# Patient Record
Sex: Male | Born: 1990 | Race: Black or African American | Hispanic: No | Marital: Single | Smoking: Current every day smoker
Health system: Southern US, Community
[De-identification: ages and names within clinical notes are randomized; demographics above are authoritative.]

## PROBLEM LIST (undated history)

## (undated) ENCOUNTER — Emergency Department (HOSPITAL_COMMUNITY): Discharge status: Absent without leave | Payer: Self-pay

## (undated) ENCOUNTER — Emergency Department (HOSPITAL_COMMUNITY): Admission: EM | Payer: Self-pay | Source: Home / Self Care

## (undated) DIAGNOSIS — A599 Trichomoniasis, unspecified: Secondary | ICD-10-CM

---
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
## 2013-01-15 ENCOUNTER — Ambulatory Visit: Payer: Self-pay

## 2013-01-16 ENCOUNTER — Ambulatory Visit (INDEPENDENT_AMBULATORY_CARE_PROVIDER_SITE_OTHER): Payer: BC Managed Care – PPO | Admitting: Family Medicine

## 2013-01-16 VITALS — BP 124/74 | HR 86 | Temp 98.0°F | Resp 16 | Ht 68.5 in | Wt 157.0 lb

## 2013-01-16 DIAGNOSIS — R61 Generalized hyperhidrosis: Secondary | ICD-10-CM

## 2013-01-16 DIAGNOSIS — Z113 Encounter for screening for infections with a predominantly sexual mode of transmission: Secondary | ICD-10-CM

## 2013-01-16 DIAGNOSIS — N50819 Testicular pain, unspecified: Secondary | ICD-10-CM

## 2013-01-16 DIAGNOSIS — N509 Disorder of male genital organs, unspecified: Secondary | ICD-10-CM

## 2013-01-16 LAB — COMPREHENSIVE METABOLIC PANEL
AST: 12 U/L (ref 0–37)
Alkaline Phosphatase: 57 U/L (ref 39–117)
BUN: 11 mg/dL (ref 6–23)
Creat: 0.77 mg/dL (ref 0.50–1.35)
Glucose, Bld: 86 mg/dL (ref 70–99)
Potassium: 4.6 mEq/L (ref 3.5–5.3)
Total Bilirubin: 0.6 mg/dL (ref 0.3–1.2)

## 2013-01-16 LAB — POCT CBC
Granulocyte percent: 71.8 %G (ref 37–80)
HCT, POC: 48.8 % (ref 43.5–53.7)
Hemoglobin: 15.6 g/dL (ref 14.1–18.1)
Lymph, poc: 1.3 (ref 0.6–3.4)
MCH, POC: 30.6 pg (ref 27–31.2)
MCHC: 32 g/dL (ref 31.8–35.4)
MCV: 95.9 fL (ref 80–97)
MID (cbc): 0.3 (ref 0–0.9)
MPV: 9.6 fL (ref 0–99.8)
POC Granulocyte: 4.1 (ref 2–6.9)
POC LYMPH PERCENT: 23 %L (ref 10–50)
POC MID %: 5.2 % (ref 0–12)
Platelet Count, POC: 295 10*3/uL (ref 142–424)
RBC: 5.09 M/uL (ref 4.69–6.13)
RDW, POC: 12.5 %
WBC: 5.7 10*3/uL (ref 4.6–10.2)

## 2013-01-16 LAB — HEPATITIS B SURFACE ANTIGEN: Hepatitis B Surface Ag: NEGATIVE

## 2013-01-16 LAB — COMPREHENSIVE METABOLIC PANEL WITH GFR
ALT: 10 U/L (ref 0–53)
Albumin: 4.7 g/dL (ref 3.5–5.2)
CO2: 27 meq/L (ref 19–32)
Calcium: 9.5 mg/dL (ref 8.4–10.5)
Chloride: 101 meq/L (ref 96–112)
Sodium: 138 meq/L (ref 135–145)
Total Protein: 6.8 g/dL (ref 6.0–8.3)

## 2013-01-16 LAB — HEPATITIS C ANTIBODY: HCV Ab: NEGATIVE

## 2013-01-16 LAB — HIV ANTIBODY (ROUTINE TESTING W REFLEX): HIV: NONREACTIVE

## 2013-01-16 LAB — RPR

## 2013-01-16 NOTE — Progress Notes (Signed)
[facility]:  Office Visit  Chief Complaint:  Chief Complaint  Patient presents with  . Annual Exam    HPI: Manuel Kim is a 22 y.o. male who complains of:  1. Has a lump on left testicle, intermittent pain, night sweats a lot, feels tired, the lump on his testicle has been getting bigger. Has not gotten more painful just larger. No LAD. No weightloss. Paternal grandfather with prostate cancer. He states it gets worse when he is having sex. Denies abd pain, peliv pain, groin pain.   2. History of chlamydia. No other STDs.   History reviewed. No pertinent past medical history. History reviewed. No pertinent past surgical history. History   Social History  . Marital Status: Single    Spouse Name: N/A    Number of Children: N/A  . Years of Education: N/A   Social History Main Topics  . Smoking status: Current Every Day Smoker -- 0.50 packs/day    Types: Cigarettes  . Smokeless tobacco: None  . Alcohol Use: Yes  . Drug Use: Yes  . Sexually Active: Yes   Other Topics Concern  . None   Social History Narrative  . None   Family History  Problem Relation Age of Onset  . Cancer Maternal Grandmother   . Cancer Paternal Grandmother   . Cancer Paternal Grandfather    No Known Allergies Prior to Admission medications   Not on File     ROS: The patient denies fevers, chills, night sweats, unintentional weight loss, chest pain, palpitations, wheezing, dyspnea on exertion, nausea, vomiting, abdominal pain, dysuria, hematuria, melena, numbness, weakness, or tingling.   All other systems have been reviewed and were otherwise negative with the exception of those mentioned in the HPI and as above.    PHYSICAL EXAM: Filed Vitals:   01/16/13 1110  BP: 124/74  Pulse: 86  Temp: 98 F (36.7 C)  Resp: 16   Filed Vitals:   01/16/13 1110  Height: 5' 8.5" (1.74 m)  Weight: 157 lb (71.215 kg)   Body mass index is 23.52 kg/(m^2).  General: Alert, no acute  distress HEENT:  Normocephalic, atraumatic, oropharynx patent.  Cardiovascular:  Regular rate and rhythm, no rubs murmurs or gallops.  No Carotid bruits, radial pulse intact. No pedal edema.  Respiratory: Clear to auscultation bilaterally.  No wheezes, rales, or rhonchi.  No cyanosis, no use of accessory musculature GI: No organomegaly, abdomen is soft and non-tender, positive bowel sounds.  No masses. Skin: No rashes. Neurologic: Facial musculature symmetric. Psychiatric: Patient is appropriate throughout our interaction. Lymphatic: No cervical lymphadenopathy Musculoskeletal: Gait intact. GU-testicles normal, no masses, lesions, dc. Scrotum is normal as well, no obvious hydrocele. No inguinal hernias, no LAD.    LABS: Results for orders placed in visit on 01/16/13  COMPREHENSIVE METABOLIC PANEL      Result Value Range   Sodium 138  135 - 145 mEq/L   Potassium 4.6  3.5 - 5.3 mEq/L   Chloride 101  96 - 112 mEq/L   CO2 27  19 - 32 mEq/L   Glucose, Bld 86  70 - 99 mg/dL   BUN 11  6 - 23 mg/dL   Creat 1.61  0.96 - 0.45 mg/dL   Total Bilirubin 0.6  0.3 - 1.2 mg/dL   Alkaline Phosphatase 57  39 - 117 U/L   AST 12  0 - 37 U/L   ALT 10  0 - 53 U/L   Total Protein 6.8  6.0 - 8.3 g/dL   Albumin 4.7  3.5 - 5.2 g/dL   Calcium 9.5  8.4 - 96.0 mg/dL  HEPATITIS B SURFACE ANTIBODY, QUANTITATIVE      Result Value Range   Hepatitis B-Post      HEPATITIS B SURFACE ANTIGEN      Result Value Range   Hepatitis B Surface Ag NEGATIVE  NEGATIVE  HEPATITIS C ANTIBODY      Result Value Range   HCV Ab NEGATIVE  NEGATIVE  RPR      Result Value Range   RPR NON REAC  NON REAC  HIV ANTIBODY (ROUTINE TESTING)      Result Value Range   HIV NON REACTIVE  NON REACTIVE  POCT CBC      Result Value Range   WBC 5.7  4.6 - 10.2 K/uL   Lymph, poc 1.3  0.6 - 3.4   POC LYMPH PERCENT 23.0  10 - 50 %L   MID (cbc) 0.3  0 - 0.9   POC MID % 5.2  0 - 12 %M   POC Granulocyte 4.1  2 - 6.9   Granulocyte percent  71.8  37 - 80 %G   RBC 5.09  4.69 - 6.13 M/uL   Hemoglobin 15.6  14.1 - 18.1 g/dL   HCT, POC 45.4  09.8 - 53.7 %   MCV 95.9  80 - 97 fL   MCH, POC 30.6  27 - 31.2 pg   MCHC 32.0  31.8 - 35.4 g/dL   RDW, POC 11.9     Platelet Count, POC 295  142 - 424 K/uL   MPV 9.6  0 - 99.8 fL     EKG/XRAY:   Primary read interpreted by Dr. Conley Rolls at [facility].   ASSESSMENT/PLAN: Encounter Diagnoses  Name Primary?  . Persistent testicular pain   . Night sweats Yes  . Screening for STD (sexually transmitted disease)    STD labs pending Patient would like testicular US F/u prn    LE, THAO PHUONG, DO 01/17/2013 8:30 AM

## 2013-01-17 LAB — GC/CHLAMYDIA PROBE AMP, URINE
Chlamydia, Swab/Urine, PCR: NEGATIVE
GC Probe Amp, Urine: NEGATIVE

## 2013-01-17 LAB — HEPATITIS B SURFACE ANTIBODY, QUANTITATIVE: Hep B S AB Quant (Post): 11.1 m[IU]/mL

## 2013-01-18 ENCOUNTER — Other Ambulatory Visit: Payer: Self-pay | Admitting: Radiology

## 2013-01-18 DIAGNOSIS — N5082 Scrotal pain: Secondary | ICD-10-CM

## 2013-01-19 ENCOUNTER — Encounter: Payer: Self-pay | Admitting: *Deleted

## 2013-01-23 ENCOUNTER — Telehealth: Payer: Self-pay

## 2013-01-23 NOTE — Telephone Encounter (Signed)
Pt called regarding labs. Notified and number updated. Also was inquiring about referral. Referrals is going to refax pt's info to [facility] because for some reason the request was denied.

## 2013-01-31 ENCOUNTER — Inpatient Hospital Stay: Admission: RE | Admit: 2013-01-31 | Payer: Self-pay | Source: Ambulatory Visit

## 2013-01-31 ENCOUNTER — Other Ambulatory Visit: Payer: Self-pay

## 2013-02-01 ENCOUNTER — Other Ambulatory Visit: Payer: Self-pay

## 2013-02-26 ENCOUNTER — Emergency Department (INDEPENDENT_AMBULATORY_CARE_PROVIDER_SITE_OTHER)
Admission: EM | Admit: 2013-02-26 | Discharge: 2013-02-26 | Disposition: A | Discharge status: Home / Self Care | Payer: BC Managed Care – PPO | Source: Home / Self Care | Attending: Family Medicine | Admitting: Family Medicine

## 2013-02-26 ENCOUNTER — Encounter (HOSPITAL_COMMUNITY): Payer: Self-pay

## 2013-02-26 DIAGNOSIS — M25519 Pain in unspecified shoulder: Secondary | ICD-10-CM

## 2013-02-26 DIAGNOSIS — M79609 Pain in unspecified limb: Secondary | ICD-10-CM

## 2013-02-26 MED ORDER — CYCLOBENZAPRINE HCL 5 MG PO TABS: 5.0000 mg | ORAL_TABLET | Freq: Three times a day (TID) | ORAL | Status: DC | PRN

## 2013-02-26 MED ORDER — IBUPROFEN 800 MG PO TABS: 800.0000 mg | ORAL_TABLET | Freq: Three times a day (TID) | ORAL | Status: DC
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
## 2013-10-27 ENCOUNTER — Emergency Department (INDEPENDENT_AMBULATORY_CARE_PROVIDER_SITE_OTHER)
Admission: EM | Admit: 2013-10-27 | Discharge: 2013-10-27 | Disposition: A | Discharge status: Home / Self Care | Payer: BC Managed Care – PPO | Source: Home / Self Care | Attending: Emergency Medicine | Admitting: Emergency Medicine

## 2013-10-27 ENCOUNTER — Encounter (HOSPITAL_COMMUNITY): Payer: Self-pay | Admitting: Emergency Medicine

## 2013-10-27 DIAGNOSIS — R69 Illness, unspecified: Principal | ICD-10-CM

## 2013-10-27 DIAGNOSIS — J111 Influenza due to unidentified influenza virus with other respiratory manifestations: Secondary | ICD-10-CM

## 2013-10-27 LAB — POCT RAPID STREP A: Streptococcus, Group A Screen (Direct): NEGATIVE

## 2013-10-27 MED ORDER — BENZONATATE 200 MG PO CAPS: 200.0000 mg | ORAL_CAPSULE | Freq: Three times a day (TID) | ORAL | Status: DC | PRN

## 2013-10-27 MED ORDER — OSELTAMIVIR PHOSPHATE 75 MG PO CAPS: 75.0000 mg | ORAL_CAPSULE | Freq: Two times a day (BID) | ORAL | Status: DC

## 2013-10-27 MED ORDER — IPRATROPIUM BROMIDE 0.06 % NA SOLN: 2.0000 | Freq: Four times a day (QID) | NASAL | Status: DC

## 2013-10-27 NOTE — Discharge Instructions (Signed)
Most upper respiratory infections are caused by viruses and do not require antibiotics.  We try to save the antibiotics for when we really need them to prevent bacteria from developing resistance to them.  Here are a few hints about things that can be done at home to help get over an upper respiratory infection quicker: ° °Get extra sleep and extra fluids.  Get 7 to 9 hours of sleep per night and 6 to 8 glasses of water a day.  Getting extra sleep keeps the immune system from getting run down.  Most people with an upper respiratory infection are a little dehydrated.  The extra fluids also keep the secretions liquified and easier to deal with.  Also, get extra vitamin C.  4000 mg per day is the recommended dose. °For the aches, headache, and fever, acetaminophen or ibuprofen are helpful.  These can be alternated every 4 hours.  People with liver disease should avoid large amounts of acetaminophen, and people with ulcer disease, gastroesophageal reflux, gastritis, congestive heart failure, chronic kidney disease, coronary artery disease and the elderly should avoid ibuprofen. °For nasal congestion try Mucinex-D, or if you're having lots of sneezing or clear nasal drainage use Zyrtec-D. People with high blood pressure can take these if their blood pressure is controlled, if not, it's best to avoid the forms with a "D" (decongestants).  You can use the plain Mucinex, Allegra, Claritin, or Zyrtec even if your blood pressure is not controlled.   °A Saline nasal spray such as Ocean Spray can also help.  You can add a decongestant sprays such as Afrin, but you should not use the decongestant sprays for more than 3 or 4 days since they can be habituating.  Breathe Rite nasal strips can also offer a non-drug alternative treatment to nasal congestion, especially at night. °For people with symptoms of sinusitis, sleeping with your head elevated can be helpful.  For sinus pain, moist, hot compresses to the face may provide some  relief.  Many people find that inhaling steam as in a shower or from a pot of steaming water can help. °For any viral infection, zinc containing lozenges such as Cold-Eze or Zicam are helpful.  Zinc helps to fight viral infection.  Hot salt water gargles (8 oz of hot water, 1/2 tsp of table salt, and a pinch of baking soda) can give relief as well as hot beverages such as hot tea.  Sucrets extra strength lozenges will help the sore throat.  °For the cough, take Delsym 2 tsp every 12 hours.  It has also been found recently that Aleve can help control a cough.  The dose is 1 to 2 tablets twice daily with food.  This can be combined with Delsym. (Note, if you are taking ibuprofen, you should not take Aleve as well--take one or the other.) °A cool mist vaporizer will help keep your mucous membranes from drying out.  ° °It's important when you have an upper respiratory infection not to pass the infection to others.  This involves being very careful about the following: ° °Frequent hand washing or use of hand sanitizer, especially after coughing, sneezing, blowing your nose or touching your face, nose or eyes. °Do not shake hands or touch anyone and try to avoid touching surfaces that other people use such as doorknobs, shopping carts, telephones and computer keyboards. °Use tissues and dispose of them properly in a garbage can or ziplock bag. °Cough into your sleeve. °Do not let others eat or   drink after you. ° °It's also important to recognize the signs of serious illness and get evaluated if they occur: °Any respiratory infection that lasts more than 7 to 10 days.  Yellow nasal drainage and sputum are not reliable indicators of a bacterial infection, but if they last for more than 1 week, see your doctor. °Fever and sore throat can indicate strep. °Fever and cough can indicate influenza or pneumonia. °Any kind of severe symptom such as difficulty breathing, intractable vomiting, or severe pain should prompt you to see  a doctor as soon as possible. ° ° °Your body's immune system is really the thing that will get rid of this infection.  Your immune system is comprised of 2 types of specialized cells called T cells and B cells.  T cells coordinate the array of cells in your body that engulf invading bacteria or viruses while B cells orchestrate the production of antibodies that neutralize infection.  Anything we do or any medications we give you, will just strengthen your immune system or help it clear up the infection quicker.  Here are a few helpful hints to improve your immune system to help overcome this illness or to prevent future infections: °· A few vitamins can improve the health of your immune system.  That's why your diet should include plenty of fruits, vegetables, fish, nuts, and whole grains. °· Vitamin A and bet-carotene can increase the cells that fight infections (T cells and B cells).  Vitamin A is abundant in dark greens and orange vegetables such as spinach, greens, sweet potatoes, and carrots. °· Vitamin B6 contributes to the maturation of white blood cells, the cells that fight disease.  Foods with vitamin B6 include cold cereal and bananas. °· Vitamin C is credited with preventing colds because it increases white blood cells and also prevents cellular damage.  Citrus fruits, peaches and green and red bell peppers are all hight in vitamin C. °· Vitamin E is an anti-oxidant that encourages the production of natural killer cells which reject foreign invaders and B cells that produce antibodies.  Foods high in vitamin E include wheat germ, nuts and seeds. °· Foods high in omega-3 fatty acids found in foods like salmon, tuna and mackerel boost your immune system and help cells to engulf and absorb germs. °· Probiotics are good bacteria that increase your T cells.  These can be found in yogurt and are available in supplements such as Culturelle or Align. °· Moderate exercise increases the strength of your immune  system and your ability to recover from illness.  I suggest 3 to 5 moderate intensity 30 minute workouts per week.   °· Sleep is another component of maintaining a strong immune system.  It enables your body to recuperate from the day's activities, stress and work.  My recommendation is to get between 7 and 9 hours of sleep per night. °· If you smoke, try to quit completely or at least cut down.  Drink alcohol only in moderation if at all.  No more than 2 drinks daily for men or 1 for women. °· Get a flu vaccine early in the fall or if you have not gotten one yet, once this illness has run its course.  If you are over 65, a smoker, or an asthmatic, get a pneumococcal vaccine. °· My final recommendation is to maintain a healthy weight.  Excess weight can impair the immune system by interfering with the way the immune system deals with invading viruses or   bacteria. ° ° ° °Influenza, Adult °Influenza ("the flu") is a viral infection of the respiratory tract. It occurs more often in winter months because people spend more time in close contact with one another. Influenza can make you feel very sick. Influenza easily spreads from person to person (contagious). °CAUSES  °Influenza is caused by a virus that infects the respiratory tract. You can catch the virus by breathing in droplets from an infected person's cough or sneeze. You can also catch the virus by touching something that was recently contaminated with the virus and then touching your mouth, nose, or eyes. °SYMPTOMS  °Symptoms typically last 4 to 10 days and may include: °· Fever. °· Chills. °· Headache, body aches, and muscle aches. °· Sore throat. °· Chest discomfort and cough. °· Poor appetite. °· Weakness or feeling tired. °· Dizziness. °· Nausea or vomiting. °DIAGNOSIS  °Diagnosis of influenza is often made based on your history and a physical exam. A nose or throat swab test can be done to confirm the diagnosis. °RISKS AND COMPLICATIONS °You may be at risk  for a more severe case of influenza if you smoke cigarettes, have diabetes, have chronic heart disease (such as heart failure) or lung disease (such as asthma), or if you have a weakened immune system. Elderly people and pregnant women are also at risk for more serious infections. The most common complication of influenza is a lung infection (pneumonia). Sometimes, this complication can require emergency medical care and may be life-threatening. °PREVENTION  °An annual influenza vaccination (flu shot) is the best way to avoid getting influenza. An annual flu shot is now routinely recommended for all adults in the [place] °TREATMENT  °In mild cases, influenza goes away on its own. Treatment is directed at relieving symptoms. For more severe cases, your caregiver may prescribe antiviral medicines to shorten the sickness. Antibiotic medicines are not effective, because the infection is caused by a virus, not by bacteria. °HOME CARE INSTRUCTIONS °· Only take over-the-counter or prescription medicines for pain, discomfort, or fever as directed by your caregiver. °· Use a cool mist humidifier to make breathing easier. °· Get plenty of rest until your temperature returns to normal. This usually takes 3 to 4 days. °· Drink enough fluids to keep your urine clear or pale yellow. °· Cover your mouth and nose when coughing or sneezing, and wash your hands well to avoid spreading the virus. °· Stay home from work or school until your fever has been gone for at least 1 full day. °SEEK MEDICAL CARE IF:  °· You have chest pain or a deep cough that worsens or produces more mucus. °· You have nausea, vomiting, or diarrhea. °SEEK IMMEDIATE MEDICAL CARE IF:  °· You have difficulty breathing, shortness of breath, or your skin or nails turn bluish. °· You have severe neck pain or stiffness. °· You have a severe headache, facial pain, or earache. °· You have a worsening or recurring fever. °· You have nausea or vomiting that cannot be  controlled. °MAKE SURE YOU: °· Understand these instructions. °· Will watch your condition. °· Will get help right away if you are not doing well or get worse. °Document Released: 08/27/2000 Document Revised: 02/29/2012 Document Reviewed: 11/29/2011 °[employer]® Patient Information ©2014 [employer]. ° °

## 2013-10-27 NOTE — ED Notes (Signed)
C/o   Sore throat.  Chills.  Cough.  Congestion.  Sweats and vomiting.    Pt has been using day quill with no relief.  Symptoms present x 3 to 4 days.

## 2013-10-27 NOTE — ED Provider Notes (Signed)
  Chief Complaint   Chief Complaint  Patient presents with  . Influenza    History of Present Illness   Manuel Kim is a 23 year old male who has had a four-day history of sweats, fatigue, chills, myalgias, nasal congestion, rhinorrhea, and headache. He's had sore throat, swelling of the throat, hoarseness, nausea, and vomiting. He has had cough with blood-tinged, yellow-green sputum and wheezing. He has been exposed to the flu.  Review of Systems   Other than as noted above, the patient denies any of the following symptoms: Systemic:  No fevers, chills, sweats, or myalgias. Eye:  No redness or discharge. ENT:  No ear pain, headache, nasal congestion, drainage, sinus pressure, or sore throat. Neck:  No neck pain, stiffness, or swollen glands. Lungs:  No cough, sputum production, hemoptysis, wheezing, chest tightness, shortness of breath or chest pain. GI:  No abdominal pain, nausea, vomiting or diarrhea.  PMFSH   Past medical history, family history, social history, meds, and allergies were reviewed.   Physical exam   Vital signs:  BP 124/69  Pulse 68  Temp(Src) 98.4 F (36.9 C) (Oral)  Resp 16  SpO2 100% General:  Alert and oriented.  In no distress.  Skin warm and dry. Eye:  No conjunctival injection or drainage. Lids were normal. ENT:  TMs and canals were normal, without erythema or inflammation.  Nasal mucosa was clear and uncongested, without drainage.  Mucous membranes were moist.  Pharynx was clear with no exudate or drainage.  There were no oral ulcerations or lesions. Neck:  Supple, no adenopathy, tenderness or mass. Lungs:  No respiratory distress.  Lungs were clear to auscultation, without wheezes, rales or rhonchi.  Breath sounds were clear and equal bilaterally.  Heart:  Regular rhythm, without gallops, murmers or rubs. Skin:  Clear, warm, and dry, without rash or lesions.  Labs   Results for orders placed during the hospital encounter of 10/27/13  POCT RAPID  STREP A ([facility] ONLY)      Result Value Ref Range   Streptococcus, Group A Screen (Direct) NEGATIVE  NEGATIVE    Assessment     The encounter diagnosis was Influenza-like illness.  Plan    1.  Meds:  The following meds were prescribed:   Discharge Medication List as of 10/27/2013  3:09 PM    START taking these medications   Details  benzonatate (TESSALON) 200 MG capsule Take 1 capsule (200 mg total) by mouth 3 (three) times daily as needed for cough., Starting 10/27/2013, Until Discontinued, Normal    ipratropium (ATROVENT) 0.06 % nasal spray Place 2 sprays into both nostrils 4 (four) times daily., Starting 10/27/2013, Until Discontinued, Normal    oseltamivir (TAMIFLU) 75 MG capsule Take 1 capsule (75 mg total) by mouth every 12 (twelve) hours., Starting 10/27/2013, Until Discontinued, Normal        2.  Patient Education/Counseling:  The patient was given appropriate handouts, self care instructions, and instructed in symptomatic relief.  Instructed to get extra fluids, rest, and use a cool mist vaporizer.    3.  Follow up:  The patient was told to follow up here if no better in 3 to 4 days, or sooner if becoming worse in any way, and given some red flag symptoms such as increasing fever, difficulty breathing, chest pain, or persistent vomiting which would prompt immediate return.  Follow up here as needed.      Reuben Likesavid C Wenona [place], MD 10/27/13 2028

## 2013-10-29 LAB — CULTURE, GROUP A STREP

## 2014-03-25 ENCOUNTER — Encounter (HOSPITAL_COMMUNITY): Payer: Self-pay | Admitting: Emergency Medicine

## 2014-03-25 ENCOUNTER — Emergency Department (INDEPENDENT_AMBULATORY_CARE_PROVIDER_SITE_OTHER)
Admission: EM | Admit: 2014-03-25 | Discharge: 2014-03-25 | Disposition: A | Discharge status: Home / Self Care | Payer: BC Managed Care – PPO | Source: Home / Self Care

## 2014-03-25 DIAGNOSIS — M25579 Pain in unspecified ankle and joints of unspecified foot: Secondary | ICD-10-CM

## 2014-03-25 DIAGNOSIS — Z711 Person with feared health complaint in whom no diagnosis is made: Secondary | ICD-10-CM

## 2014-03-25 NOTE — ED Provider Notes (Signed)
Medical screening examination/treatment/procedure(s) were performed by resident physician or non-physician practitioner and as supervising physician I was immediately available for consultation/collaboration.   Raianna Slight DOUGLAS MD.   Deano Tomaszewski D Mousa Prout, MD 03/25/14 2123 

## 2014-03-25 NOTE — ED Notes (Signed)
Needing doctors note for time missed at work.

## 2014-03-25 NOTE — ED Provider Notes (Signed)
CSN: [id number]     Arrival date & time 03/25/14  1343 History   First MD Initiated Contact with Patient 03/25/14 1428     Chief Complaint  Patient presents with  . Follow-up   (Consider location/radiation/quality/duration/timing/severity/associated sxs/prior Treatment) HPI Comments: Pt late for work because he needed to bring his insurance card to Urgent care. He wants a note stating he was here. Denies pain or any sx's. When told he had no medical reason for a note he stated his foot hurt.    History reviewed. No pertinent past medical history. History reviewed. No pertinent past surgical history. Family History  Problem Relation Age of Onset  . Cancer Maternal Grandmother   . Cancer Paternal Grandmother   . Cancer Paternal Grandfather    History  Substance Use Topics  . Smoking status: Current Every Day Smoker -- 0.50 packs/day    Types: Cigarettes  . Smokeless tobacco: Not on file  . Alcohol Use: Yes    Review of Systems  All other systems reviewed and are negative.   Allergies  Review of patient's allergies indicates no known allergies.  Home Medications   Prior to Admission medications   Medication Sig Start Date End Date Taking? Authorizing Provider  benzonatate (TESSALON) 200 MG capsule Take 1 capsule (200 mg total) by mouth 3 (three) times daily as needed for cough. 10/27/13   Reuben Likesavid C Keller, MD  cyclobenzaprine (FLEXERIL) 5 MG tablet Take 1 tablet (5 mg total) by mouth 3 (three) times daily as needed for muscle spasms. 02/26/13   Linna HoffJames D Kindl, MD  ibuprofen (ADVIL,MOTRIN) 800 MG tablet Take 1 tablet (800 mg total) by mouth 3 (three) times daily. 02/26/13   Linna HoffJames D Kindl, MD  ipratropium (ATROVENT) 0.06 % nasal spray Place 2 sprays into both nostrils 4 (four) times daily. 10/27/13   Reuben Likesavid C Keller, MD  oseltamivir (TAMIFLU) 75 MG capsule Take 1 capsule (75 mg total) by mouth every 12 (twelve) hours. 10/27/13   Reuben Likesavid C Keller, MD   BP 122/76  Pulse 62  Temp(Src) 98.4  F (36.9 C) (Oral)  Resp 14  SpO2 99% Physical Exam  Nursing note and vitals reviewed. Constitutional: He is oriented to person, place, and time. He appears well-developed and well-nourished. No distress.  Neck: Normal range of motion. Neck supple.  Cardiovascular: Normal rate.   Pulmonary/Chest: Effort normal. No respiratory distress.  Neurological: He is alert and oriented to person, place, and time.    ED Course  Procedures (including critical care time) Labs Review Labs Reviewed - No data to display  Imaging Review No results found.   MDM   1. Physically well but worried   Pt worried about being late for work. No complaints.  No medical problems, requests note for work only.    Hayden Rasmussenavid Alecsander Hattabaugh, NP 03/25/14 1513

## 2014-05-31 ENCOUNTER — Emergency Department (HOSPITAL_COMMUNITY)
Admission: EM | Admit: 2014-05-31 | Discharge: 2014-05-31 | Disposition: A | Discharge status: Home / Self Care | Payer: BC Managed Care – PPO | Attending: Emergency Medicine | Admitting: Emergency Medicine

## 2014-05-31 ENCOUNTER — Encounter (HOSPITAL_COMMUNITY): Payer: Self-pay | Admitting: Emergency Medicine

## 2014-05-31 DIAGNOSIS — Z8619 Personal history of other infectious and parasitic diseases: Secondary | ICD-10-CM | POA: Insufficient documentation

## 2014-05-31 DIAGNOSIS — R1110 Vomiting, unspecified: Secondary | ICD-10-CM

## 2014-05-31 DIAGNOSIS — R1084 Generalized abdominal pain: Secondary | ICD-10-CM | POA: Diagnosis not present

## 2014-05-31 DIAGNOSIS — F1210 Cannabis abuse, uncomplicated: Secondary | ICD-10-CM | POA: Diagnosis not present

## 2014-05-31 DIAGNOSIS — R112 Nausea with vomiting, unspecified: Secondary | ICD-10-CM | POA: Insufficient documentation

## 2014-05-31 DIAGNOSIS — F17200 Nicotine dependence, unspecified, uncomplicated: Secondary | ICD-10-CM | POA: Diagnosis not present

## 2014-05-31 HISTORY — DX: Trichomoniasis, unspecified: A59.9

## 2014-05-31 LAB — COMPREHENSIVE METABOLIC PANEL
ALBUMIN: 4.3 g/dL (ref 3.5–5.2)
ALK PHOS: 60 U/L (ref 39–117)
ALT: 22 U/L (ref 0–53)
AST: 22 U/L (ref 0–37)
Anion gap: 15 (ref 5–15)
BILIRUBIN TOTAL: 0.7 mg/dL (ref 0.3–1.2)
BUN: 10 mg/dL (ref 6–23)
CHLORIDE: 105 meq/L (ref 96–112)
CO2: 25 mEq/L (ref 19–32)
Calcium: 9.2 mg/dL (ref 8.4–10.5)
Creatinine, Ser: 0.55 mg/dL (ref 0.50–1.35)
GFR calc Af Amer: >90 mL/min (ref >90)
GFR calc non Af Amer: >90 mL/min (ref >90)
Glucose, Bld: 113 mg/dL — ABNORMAL HIGH (ref 70–99)
POTASSIUM: 3.7 meq/L (ref 3.7–5.3)
SODIUM: 145 meq/L (ref 137–147)
Total Protein: 6.9 g/dL (ref 6.0–8.3)

## 2014-05-31 LAB — URINALYSIS, ROUTINE W REFLEX MICROSCOPIC
BILIRUBIN URINE: NEGATIVE
GLUCOSE, UA: NEGATIVE mg/dL
Hgb urine dipstick: NEGATIVE
Ketones, ur: 15 mg/dL — AB
Leukocytes, UA: NEGATIVE
NITRITE: NEGATIVE
PH: 8 (ref 5.0–8.0)
Protein, ur: NEGATIVE mg/dL
SPECIFIC GRAVITY, URINE: 1.022 (ref 1.005–1.030)
Urobilinogen, UA: 0.2 mg/dL (ref 0.0–1.0)

## 2014-05-31 LAB — CBC WITH DIFFERENTIAL/PLATELET
BASOS ABS: 0 10*3/uL (ref 0.0–0.1)
BASOS PCT: 0 % (ref 0–1)
Eosinophils Absolute: 0 10*3/uL (ref 0.0–0.7)
Eosinophils Relative: 0 % (ref 0–5)
HCT: 46.5 % (ref 39.0–52.0)
Hemoglobin: 16.7 g/dL (ref 13.0–17.0)
Lymphocytes Relative: 15 % (ref 12–46)
Lymphs Abs: 1.3 10*3/uL (ref 0.7–4.0)
MCH: 32.1 pg (ref 26.0–34.0)
MCHC: 35.9 g/dL (ref 30.0–36.0)
MCV: 89.3 fL (ref 78.0–100.0)
Monocytes Absolute: 0.3 10*3/uL (ref 0.1–1.0)
Monocytes Relative: 4 % (ref 3–12)
NEUTROS ABS: 6.6 10*3/uL (ref 1.7–7.7)
Neutrophils Relative %: 81 % — ABNORMAL HIGH (ref 43–77)
PLATELETS: 277 10*3/uL (ref 150–400)
RBC: 5.21 MIL/uL (ref 4.22–5.81)
RDW: 11.9 % (ref 11.5–15.5)
WBC: 8.3 10*3/uL (ref 4.0–10.5)

## 2014-05-31 LAB — RAPID URINE DRUG SCREEN, HOSP PERFORMED
Amphetamines: NOT DETECTED
BENZODIAZEPINES: NOT DETECTED
Barbiturates: NOT DETECTED
COCAINE: NOT DETECTED
Opiates: NOT DETECTED
Tetrahydrocannabinol: POSITIVE — AB

## 2014-05-31 LAB — LIPASE, BLOOD: Lipase: 25 U/L (ref 11–59)

## 2014-05-31 LAB — ETHANOL: Alcohol, Ethyl (B): 69 mg/dL — ABNORMAL HIGH (ref 0–11)

## 2014-05-31 MED ORDER — SODIUM CHLORIDE 0.9 % IV BOLUS (SEPSIS)
1000.0000 mL | Freq: Once | INTRAVENOUS | Status: AC
  Administered 2014-05-31: 1000 mL via INTRAVENOUS

## 2014-05-31 MED ORDER — ONDANSETRON 4 MG PO TBDP
4.0000 mg | ORAL_TABLET | Freq: Once | ORAL | Status: AC
Start: 2014-05-31 — End: 2014-05-31
  Administered 2014-05-31: 4 mg via ORAL
  Filled 2014-05-31: qty 1

## 2014-05-31 MED ORDER — ONDANSETRON 4 MG PO TBDP: ORAL_TABLET | ORAL | Status: DC

## 2014-05-31 NOTE — ED Provider Notes (Signed)
  Medical screening examination/treatment/procedure(s) were performed by non-physician practitioner and as supervising physician I was immediately available for consultation/collaboration.   EKG Interpretation None         Gerhard Munch, MD 05/31/14 1558

## 2014-05-31 NOTE — ED Notes (Addendum)
Patient denies nausea. RN given sprite to sip on. Patient able to keep down and PA made aware.

## 2014-05-31 NOTE — ED Provider Notes (Signed)
CSN: [id number]     Arrival date & time 05/31/14  0827 History   First MD Initiated Contact with Patient 05/31/14 [phone]     Chief Complaint  Patient presents with  . Emesis     (Consider location/radiation/quality/duration/timing/severity/associated sxs/prior Treatment) The history is provided by the patient. No language interpreter was used.  Manuel Kim is a 23 year old male with past medical history trichomonal infection presenting to the ED with emesis that started earlier this morning. Patient reported that he was currently treated for an STD, Trichomonas approximately 2-3 days ago where he was given a one-time dose of antibiotics-patient is unable to recall the name of these antibiotics and was told not drink alcohol. Patient reported that he drinks alcohol last night, reported that he drank "a lot"-approximately 3 shots of liquor. Stated that he has been having nausea and vomiting, reported approximately 6-7 episodes of emesis-NB/NB. Reported generalized abdominal discomfort. Denied headache, dizziness, neck pain, neck stiffness, fever, chills, dysuria, hematuria, chest pain, shortness of breath, difficulty breathing, penile complaints. Denies marijuana, cocaine, heroin use. Reported that he smokes cigarettes daily. PCP none  Past Medical History  Diagnosis Date  . Trichomonas infection    History reviewed. No pertinent past surgical history. Family History  Problem Relation Age of Onset  . Cancer Maternal Grandmother   . Cancer Paternal Grandmother   . Cancer Paternal Grandfather    History  Substance Use Topics  . Smoking status: Current Every Day Smoker -- 0.50 packs/day    Types: Cigarettes  . Smokeless tobacco: Not on file  . Alcohol Use: Yes    Review of Systems  Constitutional: Negative for fever and chills.  Respiratory: Negative for chest tightness and shortness of breath.   Cardiovascular: Negative for chest pain.  Gastrointestinal: Positive for nausea, vomiting  and abdominal pain. Negative for diarrhea, constipation, blood in stool and anal bleeding.  Genitourinary: Negative for dysuria, decreased urine volume, discharge, penile pain and testicular pain.  Neurological: Negative for dizziness, weakness and headaches.      Allergies  Review of patient's allergies indicates no known allergies.  Home Medications   Prior to Admission medications   Medication Sig Start Date End Date Taking? Authorizing Provider  ondansetron (ZOFRAN ODT) 4 MG disintegrating tablet  ODT q4 hours prn nausea/vomit 05/31/14   Aubre Quincy, PA-C   BP 98/61  Pulse 90  Temp(Src) 98.5 F (36.9 C) (Oral)  Resp 16  Ht  (1.753 m)  Wt 155 lb (70.308 kg)  BMI 22.88 kg/m2  SpO2 98% Physical Exam  Nursing note and vitals reviewed. Constitutional: He is oriented to person, place, and time. He appears well-developed and well-nourished. No distress.  Smell of alcohol on breath   HENT:  Head: Normocephalic and atraumatic.  Mouth/Throat: No oropharyngeal exudate.  Dry mucous membranes  Eyes: Conjunctivae and EOM are normal. Pupils are equal, round, and reactive to light. Right eye exhibits no discharge. Left eye exhibits no discharge.  Neck: Normal range of motion. Neck supple. No tracheal deviation present.  Negative neck stiffness Negative nuchal rigidity  Negative cervical lymphadenopathy  Negative meningeal signs   Cardiovascular: Normal rate, regular rhythm and normal heart sounds.   Pulses:      Radial pulses are 2+ on the right side, and 2+ on the left side.  Cap refill < 3 seconds  Pulmonary/Chest: Effort normal and breath sounds normal. No respiratory distress. He has no wheezes. He has no rales.  Patient is able to speak in full  sentences without difficulty  Negative use of accessory muscles Negative stridor  Abdominal: Soft. Bowel sounds are normal. He exhibits no distension. There is tenderness. There is no rebound and no guarding.  Negative  abdominal distension  BS normoactive in all 4 quadrants Abdomen soft upon palpation  Generalized tenderness upon palpation to the abdomen Negative peritoneal signs Negative rigidity or guarding noted  Musculoskeletal: Normal range of motion.  Full ROM to upper and lower extremities without difficulty noted, negative ataxia noted.  Lymphadenopathy:    He has no cervical adenopathy.  Neurological: He is alert and oriented to person, place, and time. No cranial nerve deficit. He exhibits normal muscle tone. Coordination normal.  Skin: Skin is warm and dry. No rash noted. He is not diaphoretic. No erythema.  Psychiatric: He has a normal mood and affect. His behavior is normal. Thought content normal.    ED Course  Procedures (including critical care time)  1:26 PM This provider re-assessed the patient. Patient reported that he is feeling better. Patient looks better, is more awake and active. Patient able to tolerate fluids PO without difficulty, negative episodes of emesis while in the ED setting. Patient denied abdominal pain. Abdomen soft upon palpation with negative discomfort upon palpation to the abdomen. Friend at bedside to drive patient.   Results for orders placed during the hospital encounter of 05/31/14  CBC WITH DIFFERENTIAL      Result Value Ref Range   WBC 8.3  4.0 - 10.5 K/uL   RBC 5.21  4.22 - 5.81 MIL/uL   Hemoglobin 16.7  13.0 - 17.0 g/dL   HCT 96.0  45.4 - 09.8 %   MCV 89.3  78.0 - 100.0 fL   MCH 32.1  26.0 - 34.0 pg   MCHC 35.9  30.0 - 36.0 g/dL   RDW 11.9  14.7 - 82.9 %   Platelets 277  150 - 400 K/uL   Neutrophils Relative % 81 (*) 43 - 77 %   Neutro Abs 6.6  1.7 - 7.7 K/uL   Lymphocytes Relative 15  12 - 46 %   Lymphs Abs 1.3  0.7 - 4.0 K/uL   Monocytes Relative 4  3 - 12 %   Monocytes Absolute 0.3  0.1 - 1.0 K/uL   Eosinophils Relative 0  0 - 5 %   Eosinophils Absolute 0.0  0.0 - 0.7 K/uL   Basophils Relative 0  0 - 1 %   Basophils Absolute 0.0  0.0 -  0.1 K/uL  COMPREHENSIVE METABOLIC PANEL      Result Value Ref Range   Sodium 145  137 - 147 mEq/L   Potassium 3.7  3.7 - 5.3 mEq/L   Chloride 105  96 - 112 mEq/L   CO2 25  19 - 32 mEq/L   Glucose, Bld 113 (*) 70 - 99 mg/dL   BUN 10  6 - 23 mg/dL   Creatinine, Ser 5.62  0.50 - 1.35 mg/dL   Calcium 9.2  8.4 - 13.0 mg/dL   Total Protein 6.9  6.0 - 8.3 g/dL   Albumin 4.3  3.5 - 5.2 g/dL   AST 22  0 - 37 U/L   ALT 22  0 - 53 U/L   Alkaline Phosphatase 60  39 - 117 U/L   Total Bilirubin 0.7  0.3 - 1.2 mg/dL   GFR calc non Af Amer >90  >90 mL/min   GFR calc Af Amer >90  >90 mL/min   Anion gap 15  5 - 15  LIPASE, BLOOD      Result Value Ref Range   Lipase 25  11 - 59 U/L  URINALYSIS, ROUTINE W REFLEX MICROSCOPIC      Result Value Ref Range   Color, Urine YELLOW  YELLOW   APPearance CLEAR  CLEAR   Specific Gravity, Urine 1.022  1.005 - 1.030   pH 8.0  5.0 - 8.0   Glucose, UA NEGATIVE  NEGATIVE mg/dL   Hgb urine dipstick NEGATIVE  NEGATIVE   Bilirubin Urine NEGATIVE  NEGATIVE   Ketones, ur 15 (*) NEGATIVE mg/dL   Protein, ur NEGATIVE  NEGATIVE mg/dL   Urobilinogen, UA 0.2  0.0 - 1.0 mg/dL   Nitrite NEGATIVE  NEGATIVE   Leukocytes, UA NEGATIVE  NEGATIVE  ETHANOL      Result Value Ref Range   Alcohol, Ethyl (B) 69 (*) 0 - 11 mg/dL    Labs Review Labs Reviewed  CBC WITH DIFFERENTIAL - Abnormal; Notable for the following:    Neutrophils Relative % 81 (*)    All other components within normal limits  COMPREHENSIVE METABOLIC PANEL - Abnormal; Notable for the following:    Glucose, Bld 113 (*)    All other components within normal limits  URINALYSIS, ROUTINE W REFLEX MICROSCOPIC - Abnormal; Notable for the following:    Ketones, ur 15 (*)    All other components within normal limits  ETHANOL - Abnormal; Notable for the following:    Alcohol, Ethyl (B) 69 (*)    All other components within normal limits  LIPASE, BLOOD  URINE RAPID DRUG SCREEN (HOSP PERFORMED)    Imaging  Review No results found.   EKG Interpretation None      MDM   Final diagnoses:  Intractable vomiting with nausea, vomiting of unspecified type    Medications  ondansetron (ZOFRAN-ODT) disintegrating tablet 4 mg (4 mg Oral Given 05/31/14 0903)  sodium chloride 0.9 % bolus 1,000 mL (0 mLs Intravenous Stopped 05/31/14 1108)   Filed Vitals:   05/31/14 1020 05/31/14 1100 05/31/14 1215 05/31/14 1327  BP:  109/45 107/49 98/61  Pulse:  89 90 90  Temp: 98.2 F (36.8 C)  98.5 F (36.9 C) 98.5 F (36.9 C)  TempSrc: Oral  Oral Oral  Resp:   16 16  Height:      Weight:      SpO2:  99% 98% 98%   CBC unremarkable. CMP unremarkable. Lipase negative elevation. Urinalysis unremarkable-negative findings of infection or leukocytes/nitrites. Urine drug screen positive for cannabis. Ethanol 69. Patient given IV fluids and hydrated.  Patient stable, afebrile. Patient not septic appearing. Patient tolerated fluids by mouth while in ED setting-negative episodes of emesis while in ED setting. Nonsurgical abdomen noted on exam. Suspicion to be alcohol mixed with antibiotics leading to emesis - can be cyclic vomiting secondary to marijuana use as well. Discussed with patient to rest and stay hydrated. Discussed with patient to avoid alcohol. Referred to primary care provider. Discussed with patient to closely monitor symptoms and if symptoms are to worsen or change to report back to the ED - strict return instructions given.  Patient agreed to plan of care, understood, all questions answered.   Raymon Mutton, PA-C 05/31/14 1420

## 2014-05-31 NOTE — Discharge Instructions (Signed)
Please call your doctor for a followup appointment within 24-48 hours. When you talk to your doctor please let them know that you were seen in the emergency department and have them acquire all of your records so that they can discuss the findings with you and formulate a treatment plan to fully care for your new and ongoing problems. Please call and set-up an appointment with your primary care provider Please call and set-up an appointment with health and wellness center Please take anti-nausea medications as prescribed and as needed Please avoid alcohol, foods high in fat, grease, carbohydrates Please continue to monitor symptoms closely and if symptoms are to worsen or change (fever greater than 101, chills, sweating, nausea, vomiting, chest pain, shortness of breathe, difficulty breathing, weakness, numbness, tingling, worsening or changes to pain pattern, stomach pain, blood in the stools, black tarry stools) please report back to the Emergency Department immediately.    Nausea and Vomiting Nausea is a sick feeling that often comes before throwing up (vomiting). Vomiting is a reflex where stomach contents come out of your mouth. Vomiting can cause severe loss of body fluids (dehydration). Children and elderly adults can become dehydrated quickly, especially if they also have diarrhea. Nausea and vomiting are symptoms of a condition or disease. It is important to find the cause of your symptoms. CAUSES   Direct irritation of the stomach lining. This irritation can result from increased acid production (gastroesophageal reflux disease), infection, food poisoning, taking certain medicines (such as nonsteroidal anti-inflammatory drugs), alcohol use, or tobacco use.  Signals from the brain.These signals could be caused by a headache, heat exposure, an inner ear disturbance, increased pressure in the brain from injury, infection, a tumor, or a concussion, pain, emotional stimulus, or metabolic  problems.  An obstruction in the gastrointestinal tract (bowel obstruction).  Illnesses such as diabetes, hepatitis, gallbladder problems, appendicitis, kidney problems, cancer, sepsis, atypical symptoms of a heart attack, or eating disorders.  Medical treatments such as chemotherapy and radiation.  Receiving medicine that makes you sleep (general anesthetic) during surgery. DIAGNOSIS Your caregiver may ask for tests to be done if the problems do not improve after a few days. Tests may also be done if symptoms are severe or if the reason for the nausea and vomiting is not clear. Tests may include:  Urine tests.  Blood tests.  Stool tests.  Cultures (to look for evidence of infection).  X-rays or other imaging studies. Test results can help your caregiver make decisions about treatment or the need for additional tests. TREATMENT You need to stay well hydrated. Drink frequently but in small amounts.You may wish to drink water, sports drinks, clear broth, or eat frozen ice pops or gelatin dessert to help stay hydrated.When you eat, eating slowly may help prevent nausea.There are also some antinausea medicines that may help prevent nausea. HOME CARE INSTRUCTIONS   Take all medicine as directed by your caregiver.  If you do not have an appetite, do not force yourself to eat. However, you must continue to drink fluids.  If you have an appetite, eat a normal diet unless your caregiver tells you differently.  Eat a variety of complex carbohydrates (rice, wheat, potatoes, bread), lean meats, yogurt, fruits, and vegetables.  Avoid high-fat foods because they are more difficult to digest.  Drink enough water and fluids to keep your urine clear or pale yellow.  If you are dehydrated, ask your caregiver for specific rehydration instructions. Signs of dehydration may include:  Severe thirst.  Dry lips and mouth.  Dizziness.  Dark urine.  Decreasing urine frequency and  amount.  Confusion.  Rapid breathing or pulse. SEEK IMMEDIATE MEDICAL CARE IF:   You have blood or brown flecks (like coffee grounds) in your vomit.  You have black or bloody stools.  You have a severe headache or stiff neck.  You are confused.  You have severe abdominal pain.  You have chest pain or trouble breathing.  You do not urinate at least once every 8 hours.  You develop cold or clammy skin.  You continue to vomit for longer than 24 to 48 hours.  You have a fever. MAKE SURE YOU:   Understand these instructions.  Will watch your condition.  Will get help right away if you are not doing well or get worse. Document Released: 08/30/2005 Document Revised: 11/22/2011 Document Reviewed: 01/27/2011 [facility] Patient Information 2015 [place], [place]. This information is not intended to replace advice given to you by your health care provider. Make sure you discuss any questions you have with your health care provider.   Emergency Department Resource Guide 1) Find a Doctor and Pay Out of Pocket Although you won't have to find out who is covered by your insurance plan, it is a good idea to ask around and get recommendations. You will then need to call the office and see if the doctor you have chosen will accept you as a new patient and what types of options they offer for patients who are self-pay. Some doctors offer discounts or will set up payment plans for their patients who do not have insurance, but you will need to ask so you aren't surprised when you get to your appointment.  2) Contact Your Local Health Department Not all health departments have doctors that can see patients for sick visits, but many do, so it is worth a call to see if yours does. If you don't know where your local health department is, you can check in your phone book. The CDC also has a tool to help you locate your state's health department, and many state websites also have listings of all of their  local health departments.  3) Find a Walk-in Clinic If your illness is not likely to be very severe or complicated, you may want to try a walk in clinic. These are popping up all over the country in pharmacies, drugstores, and shopping centers. They're usually staffed by nurse practitioners or physician assistants that have been trained to treat common illnesses and complaints. They're usually fairly quick and inexpensive. However, if you have serious medical issues or chronic medical problems, these are probably not your best option.  No Primary Care Doctor: - Call Health Connect at  [phone] - they can help you locate a primary care doctor that  accepts your insurance, provides certain services, etc. - Physician Referral Service- [phone]  Chronic Pain Problems: Organization         Address  Phone   Notes  [facility]  [phone] Patients need to be referred by their primary care doctor.   Medication Assistance: Organization         Address  Phone   Notes  [facility] Medication [facility] [address] [place] [place], [place] [address] [phone] --Must be a resident of [facility] -- Must have NO insurance coverage whatsoever (no Medicaid/ Medicare, etc.) -- The pt. MUST have a primary care doctor that directs their care regularly and follows  them in the community   [facility]  [phone]   [employer]  [phone]    Agencies that provide inexpensive medical care: Organization         Address  Phone   Notes  [facility]  [phone]   [facility]    [phone]   [facility] [address] [place], [place] [address] [phone]   [facility] [address] [place], [place] [phone]   Planned Parenthood    [phone]   [facility]    [phone]   [facility]  [address], [place]  Phone:  [phone], Fax:  [phone] Hours of Operation:  9 am - 6 pm, M-F.  Also accepts Medicaid/Medicare and self-pay.  [facility]  [address], [place] Phone: [phone], Fax: [phone]. Hours of Operation:  8:30 am - 5:30 pm, M-F.  Also accepts Medicaid and self-pay.  [facility] [place] [address] [place] Phone: [phone]   [facility] [address] Natasha Bence [place], [place] [phone], Ext. [phone] Mondays & Thursdays: 7-9 AM.  First 15 patients are seen on a first come, first serve basis.    Medicaid-accepting [facility] Providers:  Organization         Address  Phone   Notes  [facility] [address], [place] [phone] Also accepts self-pay patients.  [facility] [address] Laurell Josephs [place], [place]  [phone]   [facility] [address], [place] [phone]   [facility] [address], [place] [phone]   Renaye Rakers [address], [place]   [phone] Only accepts [place] Access [place] patients after they have their name applied to their card.   Self-Pay (no insurance) in [facility]:  Organization         Address  Phone   Notes  Sickle Cell Patients, [facility] [address] [place], [place] [phone]   [facility] [address] [place], [place] [phone]   [facility] [place]  [address],  [phone]   [facility]/Dr. Osei-Bonsu  [address] [place] or [address], [place] [phone] Phone number for both [place] and [place] locations is the same.  [facility] [address] [place] [phone]   [facility] [address] [place] or [address]  Dr [phone]   [facility] [address] [place] [phone], phone; [phone], fax Sees patients 1st and 3rd Saturday of every month.  Must not qualify for public or private insurance (i.e. Medicaid, Medicare, [place] Health Choice, Veterans' Benefits)  Household income should be no more than 200% of the poverty level The clinic cannot treat you if you are pregnant or think you are pregnant  Sexually transmitted diseases are not treated at the clinic.    Dental Care: Organization         Address  Phone  Notes  [facility] Department of [facility] [place] [facility] [address] [place], [place] [phone] Accepts children up to age 77 who are enrolled  in Medicaid or [place] Health Choice; pregnant women with a Medicaid card; and children who have applied for Medicaid or [place] Health Choice, but were declined, whose parents can pay a reduced fee at time of service.  [facility] Department of [facility]  [address], [place] [phone] Accepts children up to age 14 who are enrolled in [place] or [place] Health Choice; pregnant women with a Medicaid card; and children who have applied for Medicaid or  Health Choice, but were declined, whose parents can pay a reduced fee at time of service.  [facility]  [address] [place], [place] [phone] Patients are seen by appointment only. Walk-ins are not accepted. [facility] will see patients 79 years of age and older. Monday - Tuesday (8am-5pm) Most Wednesdays (8:30-5pm) $30 per visit, cash only  [facility]  [address], [facility] [phone] Patients are seen by appointment only. Walk-ins are not accepted. [facility] will see patients 89 years of age and older. One Wednesday Evening (Monthly: Volunteer Based).  $30 per visit, cash only  [employer]  [phone] for  adults; Children under age 61, call Graduate Pediatric Dentistry at [phone]. Children aged 52-14, please call [phone] to request a pediatric application.  Dental services are provided in all areas of dental care including fillings, crowns and bridges, complete and partial dentures, implants, gum treatment, root canals, and extractions. Preventive care is also provided. Treatment is provided to both adults and children. Patients are selected via a lottery and there is often a waiting list.   [facility] [address], [place]  [phone] [email or web address]   [facility] [address] [place], [place] [phone], Ext. [phone] Second and Fourth Thursday of each month, opens at 6:30 AM; Clinic ends at 9 AM.  Patients are seen on a first-come first-served basis, and a limited number are seen during each clinic.   [facility]  [address] Ether Griffins [place], [place] [phone]   Eligibility Requirements You must have lived in [place], [place], or [place] counties for at least the last three months.   You cannot be eligible for state or federal sponsored [place], including [employer], [place], or [employer].   You generally cannot be eligible for healthcare insurance through your employer.    How to apply: Eligibility screenings are held every Tuesday and Wednesday afternoon from 1:00 pm until 4:00 pm. You do not need an appointment for the interview!  [facility] [address] [place], [place] [phone]   [facility] Health Department  [phone]   [facility] Health Department  [phone]   [facility] Health Department  [phone]    Behavioral Health Resources in the Community: Intensive Outpatient Programs Organization         Address  Phone  Notes  [facility] [address] [place], [place] [phone]   [facility] Outpatient  [address], [place], [place] [phone]   [facility] [address] [place], [place]  [phone]   [facility] Mental Health [address],  [place], [place] [phone] or [phone]   Substance Abuse Resources Organization         Address  Phone  Notes  [facility]  [phone]   [facility]  ([phone]  [facility]  [phone]   Floydene Flock  [phone]   Residential & Outpatient Substance Abuse Program  [phone]   Psychological Services Organization         Address  Phone  Notes  [facility] Behavioral Health  [phone]   [facility]  [phone]   [facility] Mental Health [address] [place] [phone] or [phone]    Mobile Crisis Teams Organization         Address  Phone  Notes  [facility], Mobile Crisis Care Unit  [phone]   [facility]  [address] [place], [place] [phone]   Doristine Locks [address] [place] [phone]    Self-Help/Support Groups Organization         Address  Phone             Notes  [facility] [place] - variety of support groups  336- I7437963 Call for more information  Narcotics Anonymous (NA), [facility] [address], [employer] [place]  2 meetings at this location   Statistician         Address  Phone  Notes  [facility] [address],    [place]  [phone]   [facility]  [address], [place] [address], [place], [place] [phone]   [facility] [address] [place], [place] [phone] Admissions: 8am-3pm M-F  [facility] [address]    [place], [place] [phone]   [facility] [address] [place], [place], [place] [phone]   [facility] [address],  [place], [place] [phone]   [facility]  - Intensive Outpatient [address] Laurell Josephs [address], [place], [place] [phone]   [facility] ([facility] [address] [place]  [place], [place] [phone] or 2095338575   [facility] ([facility]) [address], [place], [place] [phone] Accepts Medicaid  [facility] [address]  [place] [phone] Substance Abuse/Addiction Treatment   [facility] Organization         Address  Phone  Notes  [facility]  [phone]) [phone]   Angie Fava, PhD [address] Ervin Knack [place], [place]   [phone] or [phone]   [facility] Behavioral   [address] [place], [place] [phone]   [facility] [address] [place], [place] [phone] Insurance/Medicaid/sponsorship through [facility] and Families [address]                                    [place], [place] [phone] Therapy/tele-psych/case  [facility] [address], [place] [phone]    Dr. Lolly Mustache  [phone]   [facility]  [employer] [facility] Dept. 1) [address], [place] 2) [address] [place] 3)  [address], [place] [phone]  [phone]   [facility] Child Abuse Hotline 760-391-9517 or [phone] (After Hours)

## 2014-05-31 NOTE — ED Notes (Signed)
Pt reports that he was drinking alcohol last night and began vomiting today. Pt recently treated for STD and was told not to drink for 3 days, but he forgot.

## 2015-04-07 ENCOUNTER — Encounter (HOSPITAL_COMMUNITY): Payer: Self-pay | Admitting: Emergency Medicine

## 2015-04-07 ENCOUNTER — Emergency Department (HOSPITAL_COMMUNITY)
Admission: EM | Admit: 2015-04-07 | Discharge: 2015-04-07 | Disposition: A | Discharge status: Home / Self Care | Payer: BLUE CROSS/BLUE SHIELD | Attending: Emergency Medicine | Admitting: Emergency Medicine

## 2015-04-07 DIAGNOSIS — Z720 Tobacco use: Secondary | ICD-10-CM | POA: Diagnosis not present

## 2015-04-07 DIAGNOSIS — S46911A Strain of unspecified muscle, fascia and tendon at shoulder and upper arm level, right arm, initial encounter: Secondary | ICD-10-CM | POA: Diagnosis not present

## 2015-04-07 DIAGNOSIS — Y998 Other external cause status: Secondary | ICD-10-CM | POA: Diagnosis not present

## 2015-04-07 DIAGNOSIS — X58XXXA Exposure to other specified factors, initial encounter: Secondary | ICD-10-CM | POA: Diagnosis not present

## 2015-04-07 DIAGNOSIS — S4991XA Unspecified injury of right shoulder and upper arm, initial encounter: Secondary | ICD-10-CM | POA: Diagnosis present

## 2015-04-07 DIAGNOSIS — Y9389 Activity, other specified: Secondary | ICD-10-CM | POA: Diagnosis not present

## 2015-04-07 DIAGNOSIS — Y9289 Other specified places as the place of occurrence of the external cause: Secondary | ICD-10-CM | POA: Insufficient documentation

## 2015-04-07 DIAGNOSIS — R109 Unspecified abdominal pain: Secondary | ICD-10-CM | POA: Diagnosis not present

## 2015-04-07 LAB — COMPREHENSIVE METABOLIC PANEL
ALT: 15 U/L — ABNORMAL LOW (ref 17–63)
AST: 17 U/L (ref 15–41)
Albumin: 4.2 g/dL (ref 3.5–5.0)
Alkaline Phosphatase: 64 U/L (ref 38–126)
Anion gap: 7 (ref 5–15)
BILIRUBIN TOTAL: 0.7 mg/dL (ref 0.3–1.2)
BUN: 17 mg/dL (ref 6–20)
CHLORIDE: 106 mmol/L (ref 101–111)
CO2: 28 mmol/L (ref 22–32)
Calcium: 9.3 mg/dL (ref 8.9–10.3)
Creatinine, Ser: 0.9 mg/dL (ref 0.61–1.24)
GFR calc Af Amer: >60 mL/min (ref >60)
GFR calc non Af Amer: >60 mL/min (ref >60)
Glucose, Bld: 141 mg/dL — ABNORMAL HIGH (ref 65–99)
Potassium: 3.9 mmol/L (ref 3.5–5.1)
Sodium: 141 mmol/L (ref 135–145)
TOTAL PROTEIN: 6.7 g/dL (ref 6.5–8.1)

## 2015-04-07 LAB — URINALYSIS, ROUTINE W REFLEX MICROSCOPIC
BILIRUBIN URINE: NEGATIVE
GLUCOSE, UA: NEGATIVE mg/dL
HGB URINE DIPSTICK: NEGATIVE
KETONES UR: 15 mg/dL — AB
Leukocytes, UA: NEGATIVE
Nitrite: NEGATIVE
Protein, ur: NEGATIVE mg/dL
Specific Gravity, Urine: 1.027 (ref 1.005–1.030)
UROBILINOGEN UA: 0.2 mg/dL (ref 0.0–1.0)
pH: 6 (ref 5.0–8.0)

## 2015-04-07 LAB — CBC
HCT: 43.2 % (ref 39.0–52.0)
HEMOGLOBIN: 14.9 g/dL (ref 13.0–17.0)
MCH: 30.8 pg (ref 26.0–34.0)
MCHC: 34.5 g/dL (ref 30.0–36.0)
MCV: 89.4 fL (ref 78.0–100.0)
Platelets: 263 10*3/uL (ref 150–400)
RBC: 4.83 MIL/uL (ref 4.22–5.81)
RDW: 12.7 % (ref 11.5–15.5)
WBC: 8.8 10*3/uL (ref 4.0–10.5)

## 2015-04-07 LAB — LIPASE, BLOOD: LIPASE: 20 U/L — AB (ref 22–51)

## 2015-04-07 MED ORDER — IBUPROFEN 800 MG PO TABS: 800.0000 mg | ORAL_TABLET | Freq: Four times a day (QID) | ORAL | Status: AC | PRN

## 2015-04-07 MED ORDER — METHOCARBAMOL 500 MG PO TABS: 500.0000 mg | ORAL_TABLET | Freq: Two times a day (BID) | ORAL | Status: AC

## 2015-04-07 MED ORDER — IBUPROFEN 800 MG PO TABS
800.0000 mg | ORAL_TABLET | Freq: Once | ORAL | Status: AC
  Administered 2015-04-07: 800 mg via ORAL
  Filled 2015-04-07: qty 1

## 2015-04-07 NOTE — Discharge Instructions (Signed)
RICE: Routine Care for Injuries °The routine care of many injuries includes Rest, Ice, Compression, and Elevation (RICE). °HOME CARE INSTRUCTIONS °· Rest is needed to allow your body to heal. Routine activities can usually be resumed when comfortable. Injured tendons and bones can take up to 6 weeks to heal. Tendons are the cord-like structures that attach muscle to bone. °· Ice following an injury helps keep the swelling down and reduces pain. °¨ Put ice in a plastic bag. °¨ Place a towel between your skin and the bag. °¨ Leave the ice on for 15-20 minutes, 3-4 times a day, or as directed by your health care provider. Do this while awake, for the first 24 to 48 hours. After that, continue as directed by your caregiver. °· Compression helps keep swelling down. It also gives support and helps with discomfort. If an elastic bandage has been applied, it should be removed and reapplied every 3 to 4 hours. It should not be applied tightly, but firmly enough to keep swelling down. Watch fingers or toes for swelling, bluish discoloration, coldness, numbness, or excessive pain. If any of these problems occur, remove the bandage and reapply loosely. Contact your caregiver if these problems continue. °· Elevation helps reduce swelling and decreases pain. With extremities, such as the arms, hands, legs, and feet, the injured area should be placed near or above the level of the heart, if possible. °SEEK IMMEDIATE MEDICAL CARE IF: °· You have persistent pain and swelling. °· You develop redness, numbness, or unexpected weakness. °· Your symptoms are getting worse rather than improving after several days. °These symptoms may indicate that further evaluation or further X-rays are needed. Sometimes, X-rays may not show a small broken bone (fracture) until 1 week or 10 days later. Make a follow-up appointment with your caregiver. Ask when your X-ray results will be ready. Make sure you get your X-ray results. °Document Released:  12/12/2000 Document Revised: 09/04/2013 Document Reviewed: 01/29/2011 °ExitCare® Patient Information ©2015 [employer]. This information is not intended to replace advice given to you by your health care provider. Make sure you discuss any questions you have with your health care provider. ° ° °Emergency Department Resource Guide °1) Find a Doctor and Pay Out of Pocket °Although you won't have to find out who is covered by your insurance plan, it is a good idea to ask around and get recommendations. You will then need to call the office and see if the doctor you have chosen will accept you as a new patient and what types of options they offer for patients who are self-pay. Some doctors offer discounts or will set up payment plans for their patients who do not have insurance, but you will need to ask so you aren't surprised when you get to your appointment. ° °2) Contact Your Local Health Department °Not all health departments have doctors that can see patients for sick visits, but many do, so it is worth a call to see if yours does. If you don't know where your local health department is, you can check in your phone book. The CDC also has a tool to help you locate your state's health department, and many state websites also have listings of all of their local health departments. ° °3) Find a Walk-in Clinic °If your illness is not likely to be very severe or complicated, you may want to try a walk in clinic. These are popping up all over the country in pharmacies, drugstores, and shopping centers. They're usually   staffed by nurse practitioners or physician assistants that have been trained to treat common illnesses and complaints. They're usually fairly quick and inexpensive. However, if you have serious medical issues or chronic medical problems, these are probably not your best option. ° °No Primary Care Doctor: °- Call [facility] at  832-8000 - they can help you locate a primary care doctor that  accepts  your insurance, provides certain services, etc. °- Physician Referral Service- 1-800-533-3463 ° °Chronic Pain Problems: °Organization         Address  Phone   Notes  °[facility]  (336) 297-2271 Patients need to be referred by their primary care doctor.  ° °Medication Assistance: °Organization         Address  Phone   Notes  °[place] [facility] [address] °[place], [place] [address] (336) 641-8030 --Must be a resident of [place] °-- Must have NO insurance coverage whatsoever (no Medicaid/ Medicare, etc.) °-- The pt. MUST have a primary care doctor that directs their care regularly and follows them in the community °  °[facility]  (866) 331-1348   °[employer]  (888) 892-1162   ° °Agencies that provide inexpensive medical care: °Organization         Address  Phone   Notes  °[facility]  (336) 832-8035   °[facility]    (336) 832-7272   °[facility] [address] °[place], [place] [address] (336) 832-4777   °[facility] [address], °[place] (336) 271-4999   °[facility]    (336) 373-0678   °[facility]    (336) 272-1050   °[facility] ° [address], [place] Phone:  (336) 832-4444, Fax:  (336) 832-4440 Hours of Operation:  9 am - 6 pm, M-F.  Also accepts Medicaid/Medicare and self-pay.  °[facility] ° [address], [place] Phone: (336) 832-3150, Fax: (336) 832-3151. Hours of Operation:  8:30 am - 5:30 pm, M-F.  Also accepts Medicaid and self-pay.  °[facility] [place] [address], [place] Phone: (336) 878-6027   °[facility] [address], [place], [place] (336)723-1848, Ext. 123 Mondays & Thursdays: 7-9 AM.  First 15 patients are seen on a first come, first serve basis. °  ° °Medicaid-accepting [place] Providers: ° °Organization          Address  Phone   Notes  °[facility] [address], [place] (336) 641-2100 Also accepts self-pay patients.  °[facility] [address], [place] ° (336) 856-9996   °[facility] [address], [place] (336) 288-8857   °[facility] [address] [place] [address], [place] (336) 299-7000   °Veita Bland [address], [place]  ° (336) 373-1557 Only accepts [place] Access Medicaid patients after they have their name applied to their card.  ° °Self-Pay (no insurance) in [place]: ° °Organization         Address  Phone   Notes  °Sickle Cell Patients, [facility] [address], [place] (336) 832-1970   °[facility] [address],  (336) 832-4400   °[facility] ° [address], [place] (336) 992-4800   °[facility]/Dr. Osei-Bonsu ° [address] [place] [address], [place] or [address], [place] (336) 841-8500 Phone number for both [place] and [place] locations is the same.  °[facility] [address], [place] (336) 299-0000   °[facility] [address] [place] [address], [place] or [address] (336) 852-7530 °(336) 878-2260   °[facility] [address], [place] (336) 350-1642, phone; (336) 294-5005, fax Sees patients 1st and 3rd Saturday of every month.  Must not qualify for public or private insurance (i.e. Medicaid, Medicare, [place] Health Choice, Veterans' Benefits) • Household income should be no more than 200% of the poverty level •The clinic cannot treat you if you are pregnant or think you are pregnant • Sexually transmitted diseases are not treated at the clinic.  ° ° °Dental Care: °Organization         Address  Phone  Notes  °[facility] [address],  [place] (336) 641-6152 Accepts children up to age 21 who are enrolled in Medicaid or [place] Health Choice; pregnant women with a Medicaid card; and children who have applied for Medicaid or [place] Health Choice, but were declined, whose parents can pay a reduced fee at time of service.  °[facility] [place]  [address], [place] (336) 641-7733 Accepts children up to age 21 who are enrolled in Medicaid or [place] Health Choice; pregnant women with a Medicaid card; and children who have applied for Medicaid or [place] Health Choice, but were declined, whose parents can pay a reduced fee at time of service.  °[facility] ° [address], [place] (336) 641-4533 Patients are seen by appointment only. Walk-ins are not accepted. [facility] will see patients 18 years of age and older. °Monday - Tuesday (8am-5pm) °Most Wednesdays (8:30-5pm) °$30 per visit, cash only  °[facility] ° [address], [place] (336) 641-4533 Patients are seen by appointment only. Walk-ins are not accepted. [facility] will see patients 18 years of age and older. °One Wednesday Evening (Monthly: Volunteer Based).  $30 per visit, cash only  °[facility]  (919) 537-3737 for adults; Children under age 4, call Graduate Pediatric Dentistry at ([phone]) [phone]. Children aged 4-14, please call (919) [phone] to request a pediatric application. ° Dental services are provided in all areas of dental care including fillings, crowns and bridges, complete and partial dentures, implants, gum treatment, root canals, and extractions. Preventive care is also provided. Treatment is provided to both adults and children. °Patients are selected via a lottery and there is often a waiting list. °  °[facility] [address], ° ° (336) 763-8833 [email or web address] °  °[facility] [address], [place], [place]  (336)723-1848, Ext. 123 Second and Fourth Thursday of each month, opens at 6:30 AM; Clinic ends at 9 AM.  Patients are seen on a first-come first-served basis, and a limited number are seen during each clinic.  ° °[facility] ° [address], [place],  (336) 723-7904   Eligibility Requirements °You must have lived in [place], [place], or [place] counties for at least the last three months. °  You cannot be eligible for state or federal sponsored healthcare insurance, including Veterans Administration, Medicaid, or Medicare. °  You generally cannot be eligible for healthcare insurance through your   employer.  °  How to apply: °Eligibility screenings are held every Tuesday and Wednesday afternoon from 1:00 pm until 4:00 pm. You do not need an appointment for the interview!  °[facility] [address], [place], [place] 336-631-2330   °[facility]  336-342-8273   °[facility]  336-703-3100   °[facility]  336-570-6415   ° °Behavioral Health Resources in the Community: °Intensive Outpatient Programs °Organization         Address  Phone  Notes  °[facility] [address], [place], [place] 336-878-6098   °[facility] [address], [place], [place] 336-832-9800   °[facility] [address], [place], [place] ° 336-882-2125   °[facility] [address],  °[place], [place] 1-800-853-5163 or 336-641-4981   °Substance Abuse Resources °Organization         Address  Phone  Notes  °[facility]  336-882-2125   °[facility]  336-784-9470   °[facility]  336-285-9073   °[facility]  336-845-3988   °Residential & Outpatient Substance Abuse Program  1-800-659-3381   °Psychological Services °Organization         Address  Phone  Notes  °[facility]  336- 832-9600   °[facility]  336- 378-7881    °[facility] [address], [place] 1-800-853-5163 or 336-641-4981   ° °Mobile Crisis Teams °Organization         Address  Phone  Notes  °[facility], Mobile Crisis Care Unit  1-877-626-1772   °Assertive °[facility] ° [address] [place], [place] 336-834-9664   °Sharon DeEsch [address] °[place] 336-554-5454   ° °Self-Help/Support Groups °Organization         Address  Phone             Notes  °[facility] - variety of support groups  336- 373-1402 Call for more information  °Narcotics Anonymous (NA), [facility] [address], °[place]  2 meetings at this location  ° °Residential Treatment Programs °Organization         Address  Phone  Notes  °[facility] [address],    °[place]  1-866-801-8205   °[facility] ° [address], [place], [place] 704-293-8524   °[facility] [address], [place] 336-845-3988 Admissions: 8am-3pm M-F  °[facility] [address]    °[place], [place] 336-841-1104   °[facility] [address], [place], [place] 336-379-7146   °[facility] [address]  °[place], [place] 336-285-9073   °[facility] - Intensive Outpatient [address], [place], [place] 336-852-3033   °[facility] [address]  °[place], [place] 1-877-615-2722 or 336-784-9470   °[facility] [address] [place], [place] 336-227-7417 Accepts Medicaid  °[facility] [address]  °[place] 1-800-659-3381 Substance Abuse/Addiction Treatment  ° °[place] Behavioral Health Resources °Organization         Address  Phone  Notes  °[facility]  (888) 581-9988   °Julie Brannon, PhD [address] [place], [place]   (336) 349-5553 or (336) 951-0000   °[facility]   [address] °[place],  (336) 349-4454   °  [facility] [address], [place], [place] (336) 342-8316 Insurance/Medicaid/sponsorship through [facility]  °[facility] [address]                                    [place], [place] (336) 342-8316 Therapy/tele-psych/case  °[facility] [address]  ° [place],  (336) 349-2233    °Dr. Arfeen  (336) 349-4544   °[facility] [place]  [employer] [place] [facility] 1) [address], [place] °2) [address], [place] °3)  [address], [place] (336) 349-3220 °(336) 342-7768 ° °(336) 342-8140   °[place] [facility] (336) 342-1394 or (336) 342-3537 (After Hours)    ° ° ° °

## 2015-04-07 NOTE — ED Provider Notes (Signed)
CSN: [id number]     Arrival date & time 04/07/15  1121 History   First MD Initiated Contact with Patient 04/07/15 1500     Chief Complaint  Patient presents with  . Abdominal Pain     (Consider location/radiation/quality/duration/timing/severity/associated sxs/prior Treatment) HPI   24 year old generally healthy male presents with abd pain and R shoulder pain.  Pt report he works in [employer] and injured his R shoulder 5 days ago.  Sts he felt a pulled sensation to R shoulder blade after heavy lifting.  Report pain is mild to moderate, improving but not resolved.  No specific treatment tried. Last night while laying in bed he experienced a sharp squeezing sensation to L flank.  Pain was waxing and waning with moderate intensity and nothing seems to make it better or worse.  He tries applying pressure and moving around without improvement.  He missed work today because he did not get out of bed.  Since waiting in the ER his symptom has improved without treatment.  Denies fever, chills, cp, sob, productive cough, lightheadedness, dizziness, dysuria, hematuria, hematochezia or melena.  Denies any recent trauma.  Does admit to drinking several shot of liquor over the weekend but is not a regular alcohol drinker.  No prior hx of kidney stone.  No hx of diabetes or alcohol abuse.    Past Medical History  Diagnosis Date  . Trichomonas infection    History reviewed. No pertinent past surgical history. Family History  Problem Relation Age of Onset  . Cancer Maternal Grandmother   . Cancer Paternal Grandmother   . Cancer Paternal Grandfather    History  Substance Use Topics  . Smoking status: Current Every Day Smoker -- 0.50 packs/day    Types: Cigarettes  . Smokeless tobacco: Not on file  . Alcohol Use: Yes    Review of Systems  All other systems reviewed and are negative.     Allergies  Review of patient's allergies indicates no known allergies.  Home Medications   Prior to  Admission medications   Medication Sig Start Date End Date Taking? Authorizing Provider  ondansetron (ZOFRAN ODT) 4 MG disintegrating tablet 4mg  ODT q4 hours prn nausea/vomit 05/31/14   Marissa Sciacca, PA-C   BP 129/65 mmHg  Pulse 79  Temp(Src) 98.5 F (36.9 C)  Resp 19  Ht 5\' 8"  (1.727 m)  Wt 154 lb 3.2 oz (69.945 kg)  BMI 23.45 kg/m2  SpO2 97% Physical Exam  Constitutional: He is oriented to person, place, and time. He appears well-developed and well-nourished. No distress.  AAM sitting in bed watching TV in no acute distress  HENT:  Head: Atraumatic.  Eyes: Conjunctivae are normal.  Neck: Neck supple.  Cardiovascular: Normal rate and regular rhythm.   Pulmonary/Chest: Effort normal and breath sounds normal. He exhibits no tenderness.  Abdominal: Soft. Bowel sounds are normal. He exhibits no distension. There is no tenderness.  Genitourinary:  No CVA tenderness  Musculoskeletal: He exhibits tenderness (R shoulder: mild tenderness along posterior scapular region on palpation with normal appearance, no winged scapular.  R shoulder with FROM, non tender.  no midline spine tenderness, crepitus or step off). He exhibits no edema.  Neurological: He is alert and oriented to person, place, and time.  Skin: No rash noted.  Psychiatric: He has a normal mood and affect.  Nursing note and vitals reviewed.   ED Course  Procedures (including critical care time)  3:17 PM R posterior shoulder pain, likely MSK as it  is reproducible.  Doubt fx/dislocation.  Recommend RICE.  L flank pain, not reproducible on exam and pt has benign abdomen.  Will check labs, UA and will consider further treatment.  Pt also request work note.    4:51 PM UA shows no evidence of blood to suggest kidney stone or signs of urinary tract infection. Patient is currently resting comfortably and felt a bit better after receiving ibuprofen. Patient will be discharge with muscle relaxant and ibuprofen along with list of  resources and work note. Return precautions discussed.  Labs Review Labs Reviewed  LIPASE, BLOOD - Abnormal; Notable for the following:    Lipase 20 (*)    All other components within normal limits  COMPREHENSIVE METABOLIC PANEL - Abnormal; Notable for the following:    Glucose, Bld 141 (*)    ALT 15 (*)    All other components within normal limits  URINALYSIS, ROUTINE W REFLEX MICROSCOPIC (NOT AT [facility]) - Abnormal; Notable for the following:    Ketones, ur 15 (*)    All other components within normal limits  CBC    Imaging Review No results found.   EKG Interpretation None      MDM   Final diagnoses:  Left flank pain  Right shoulder strain, initial encounter    BP 127/81 mmHg  Pulse 63  Temp(Src) 98.5 F (36.9 C)  Resp 19  Ht  (1.727 m)  Wt 154 lb 3.2 oz (69.945 kg)  BMI 23.45 kg/m2  SpO2 100%     Fayrene Helper, PA-C 04/08/15 2233  Purvis Sheffield, MD 04/11/15 [phone]

## 2015-04-07 NOTE — ED Notes (Signed)
Pt. Stated, I started feeling bad last night with abdominal pain I couldn't hardly get out of the bed this morning

## 2015-04-07 NOTE — ED Notes (Signed)
Pt verbalized understanding of discharge instructions. A/O x4 on departure without any pain. VSS. Ambulatory with steady gait.

## 2017-07-09 ENCOUNTER — Emergency Department (HOSPITAL_COMMUNITY)
Admission: EM | Admit: 2017-07-09 | Discharge: 2017-07-09 | Disposition: A | Discharge status: Home / Self Care | Payer: BLUE CROSS/BLUE SHIELD | Attending: Emergency Medicine | Admitting: Emergency Medicine

## 2017-07-09 ENCOUNTER — Encounter (HOSPITAL_COMMUNITY): Payer: Self-pay | Admitting: *Deleted

## 2017-07-09 DIAGNOSIS — R369 Urethral discharge, unspecified: Secondary | ICD-10-CM | POA: Insufficient documentation

## 2017-07-09 DIAGNOSIS — Z113 Encounter for screening for infections with a predominantly sexual mode of transmission: Secondary | ICD-10-CM | POA: Insufficient documentation

## 2017-07-09 DIAGNOSIS — F17210 Nicotine dependence, cigarettes, uncomplicated: Secondary | ICD-10-CM | POA: Insufficient documentation

## 2017-07-09 DIAGNOSIS — Z711 Person with feared health complaint in whom no diagnosis is made: Secondary | ICD-10-CM

## 2017-07-09 LAB — URINALYSIS, ROUTINE W REFLEX MICROSCOPIC
Bilirubin Urine: NEGATIVE
Glucose, UA: NEGATIVE mg/dL
HGB URINE DIPSTICK: NEGATIVE
Ketones, ur: NEGATIVE mg/dL
Leukocytes, UA: NEGATIVE
Nitrite: NEGATIVE
PH: 7 (ref 5.0–8.0)
Protein, ur: NEGATIVE mg/dL
Specific Gravity, Urine: 1.019 (ref 1.005–1.030)

## 2017-07-09 MED ORDER — CEFTRIAXONE SODIUM 250 MG IJ SOLR
250.0000 mg | Freq: Once | INTRAMUSCULAR | Status: AC
  Administered 2017-07-09: 250 mg via INTRAMUSCULAR
  Filled 2017-07-09: qty 250

## 2017-07-09 MED ORDER — AZITHROMYCIN 250 MG PO TABS
1000.0000 mg | ORAL_TABLET | Freq: Once | ORAL | Status: AC
  Administered 2017-07-09: 1000 mg via ORAL
  Filled 2017-07-09: qty 4

## 2017-07-09 MED ORDER — LIDOCAINE HCL (PF) 1 % IJ SOLN
INTRAMUSCULAR | Status: AC
  Administered 2017-07-09: 5 mL
  Filled 2017-07-09: qty 5

## 2017-07-09 NOTE — ED Triage Notes (Signed)
Pt reports his girlfriend tested positive for chlamydia, pt reports penile drainage, pt also reports R ring  Finger swelling onset x 4 days, A&o X4

## 2017-07-09 NOTE — ED Provider Notes (Signed)
[facility] EMERGENCY DEPARTMENT Provider Note   CSN: [id number] Arrival date & time: 07/09/17  1353     History   Chief Complaint Chief Complaint  Patient presents with  . Exposure to STD    HPI Manuel Kim is a 26 y.o. male with no significant past medical history, who presents to ED for evaluation of STD testing after having unprotected sexual intercourse with his girlfriend who recently tested positive for chlamydia. He reports intermittent penile discharge. He denies any dysuria, fevers, abdominal pain, vomiting, bowel changes.  HPI  Past Medical History:  Diagnosis Date  . Trichomonas infection     There are no active problems to display for this patient.   History reviewed. No pertinent surgical history.     Home Medications    Prior to Admission medications   Medication Sig Start Date End Date Taking? Authorizing Provider  ibuprofen (ADVIL,MOTRIN) 800 MG tablet Take 1 tablet (800 mg total) by mouth every 6 (six) hours as needed. 04/07/15   Fayrene Helper, PA-C  methocarbamol (ROBAXIN) 500 MG tablet Take 1 tablet (500 mg total) by mouth 2 (two) times daily. 04/07/15   Fayrene Helper, PA-C  ondansetron (ZOFRAN ODT) 4 MG disintegrating tablet 4mg  ODT q4 hours prn nausea/vomit Patient not taking: Reported on 04/07/2015 05/31/14   Raymon Mutton, PA-C    Family History Family History  Problem Relation Age of Onset  . Cancer Maternal Grandmother   . Cancer Paternal Grandmother   . Cancer Paternal Grandfather     Social History Social History  Substance Use Topics  . Smoking status: Current Every Day Smoker    Packs/day: 0.50    Types: Cigarettes  . Smokeless tobacco: Not on file  . Alcohol use Yes     Allergies   Patient has no known allergies.   Review of Systems Review of Systems  Constitutional: Negative for chills and fever.  Gastrointestinal: Negative for abdominal pain, constipation, diarrhea, nausea and vomiting.    Genitourinary: Positive for discharge. Negative for dysuria, flank pain, genital sores, penile pain, penile swelling, scrotal swelling and testicular pain.  Skin: Negative for rash.     Physical Exam Updated Vital Signs BP (!) 143/63 (BP Location: Right Arm)   Pulse 98   Temp 98.5 F (36.9 C) (Oral)   Resp 18   SpO2 100%   Physical Exam  Constitutional: He appears well-developed and well-nourished. No distress.  HENT:  Head: Normocephalic and atraumatic.  Eyes: Conjunctivae and EOM are normal. No scleral icterus.  Neck: Normal range of motion.  Pulmonary/Chest: Effort normal. No respiratory distress.  Genitourinary: Testes normal. Right testis shows no swelling and no tenderness. Left testis shows no swelling and no tenderness. Circumcised. Discharge found.  Genitourinary Comments: Normal male genitalia noted. Penis, scrotum, and testicles without swelling, lesions, rashes, or tenderness present. Slight white penile discharge noted. Cremasteric reflex intact. RN served as Biomedical engineer during the exam.   Neurological: He is alert.  Skin: No rash noted. He is not diaphoretic.  Psychiatric: He has a normal mood and affect.  Nursing note and vitals reviewed.    ED Treatments / Results  Labs (all labs ordered are listed, but only abnormal results are displayed) Labs Reviewed  URINALYSIS, ROUTINE W REFLEX MICROSCOPIC  HIV ANTIBODY (ROUTINE TESTING)  RPR  GC/CHLAMYDIA PROBE AMP ([place]) NOT AT [facility]    EKG  EKG Interpretation None       Radiology No results found.  Procedures Procedures (including critical care  time)  Medications Ordered in ED Medications  cefTRIAXone (ROCEPHIN) injection 250 mg (250 mg Intramuscular Given 07/09/17 1527)  azithromycin (ZITHROMAX) tablet 1,000 mg (1,000 mg Oral Given 07/09/17 1527)  lidocaine (PF) (XYLOCAINE) 1 % injection (5 mLs  Given 07/09/17 1527)     Initial Impression / Assessment and Plan / ED Course  I have reviewed  the triage vital signs and the nursing notes.  Pertinent labs & imaging results that were available during my care of the patient were reviewed by me and considered in my medical decision making (see chart for details).     Patient presents to ED for evaluation of STD testing. He had unprotected sexual intercourse with his girlfriend several days ago and was told today that she tested positive for chlamydia. He reports intermittent penile discharge. He denies any dysuria, fevers, abdominal pain, bowel changes. He does have white penile discharge noted on physical exam. He is otherwise well-appearing. HIV and RPR sent. Treated empirically for gonorrhea and chlamydia. Urinalysis with no evidence of UTI. Advised to follow-up with remaining lab work when available. Patient appears stable for discharge at this time. Strict return precautions given.  Final Clinical Impressions(s) / ED Diagnoses   Final diagnoses:  Concern about STD in male without diagnosis    New Prescriptions New Prescriptions   No medications on file     Dietrich PatesKhatri, Ahren Pettinger, PA-C 07/09/17 1616    Dione BoozeGlick, David, MD 07/10/17 [phone]

## 2017-07-09 NOTE — ED Notes (Signed)
Declined W/C at D/C and was escorted to lobby by RN. 

## 2017-07-09 NOTE — Discharge Instructions (Signed)
Follow-up with results of remaining lab work when available. Return to ED for worsening symptoms, abdominal pain, penile swelling or tenderness, fevers.

## 2017-07-10 LAB — HIV ANTIBODY (ROUTINE TESTING W REFLEX): HIV SCREEN 4TH GENERATION: NONREACTIVE

## 2017-07-10 LAB — RPR: RPR Ser Ql: NONREACTIVE

## 2017-07-11 LAB — GC/CHLAMYDIA PROBE AMP (~~LOC~~) NOT AT ARMC
Chlamydia: NEGATIVE
NEISSERIA GONORRHEA: NEGATIVE

## 2017-07-12 ENCOUNTER — Encounter (HOSPITAL_COMMUNITY): Payer: Self-pay

## 2017-07-12 ENCOUNTER — Emergency Department (HOSPITAL_COMMUNITY)
Admission: EM | Admit: 2017-07-12 | Discharge: 2017-07-12 | Disposition: A | Discharge status: Home / Self Care | Payer: Self-pay | Attending: Emergency Medicine | Admitting: Emergency Medicine

## 2017-07-12 DIAGNOSIS — Z79899 Other long term (current) drug therapy: Secondary | ICD-10-CM | POA: Insufficient documentation

## 2017-07-12 DIAGNOSIS — L03012 Cellulitis of left finger: Secondary | ICD-10-CM | POA: Insufficient documentation

## 2017-07-12 DIAGNOSIS — F17210 Nicotine dependence, cigarettes, uncomplicated: Secondary | ICD-10-CM | POA: Insufficient documentation

## 2017-07-12 MED ORDER — LIDOCAINE HCL (PF) 1 % IJ SOLN
10.0000 mL | Freq: Once | INTRAMUSCULAR | Status: AC
  Administered 2017-07-12: 10 mL via INTRADERMAL
  Filled 2017-07-12: qty 10

## 2017-07-12 MED ORDER — AMOXICILLIN-POT CLAVULANATE 875-125 MG PO TABS: 1.0000 | ORAL_TABLET | Freq: Two times a day (BID) | ORAL | 0 refills | Status: AC

## 2017-07-12 NOTE — ED Provider Notes (Signed)
[facility]-EMERGENCY DEPT Provider Note   CSN: [id number] Arrival date & time: 07/12/17  0146     History   Chief Complaint Chief Complaint  Patient presents with  . finger infection    HPI Manuel Kim is a 26 y.o. male with no major medical problems presents to the Emergency Department complaining of gradual, persistent, progressively worsening swelling and pain to the distal aspect of the left long finger onset approximately 4 days ago.  Patient reports that he does bite his nails.  The swelling and discoloration initially began along the radial side of the nail fold and spread throughout the entire distal tip.  Patient denies fevers or chills, nausea or vomiting.  No treatments prior to arrival.  Palpation makes his symptoms worse.  Nothing makes them better.    The history is provided by the patient and medical records. No language interpreter was used.    Past Medical History:  Diagnosis Date  . Trichomonas infection     There are no active problems to display for this patient.   History reviewed. No pertinent surgical history.     Home Medications    Prior to Admission medications   Medication Sig Start Date End Date Taking? Authorizing Provider  amoxicillin-clavulanate (AUGMENTIN) 875-125 MG tablet Take 1 tablet by mouth 2 (two) times daily. One po bid x 7 days 07/12/17   Shauntae Reitman, Dahlia Client, PA-C  ibuprofen (ADVIL,MOTRIN) 800 MG tablet Take 1 tablet (800 mg total) by mouth every 6 (six) hours as needed. 04/07/15   Fayrene Helper, PA-C  methocarbamol (ROBAXIN) 500 MG tablet Take 1 tablet (500 mg total) by mouth 2 (two) times daily. 04/07/15   Fayrene Helper, PA-C  ondansetron (ZOFRAN ODT) 4 MG disintegrating tablet 4mg  ODT q4 hours prn nausea/vomit Patient not taking: Reported on 04/07/2015 05/31/14   Raymon Mutton, PA-C    Family History Family History  Problem Relation Age of Onset  . Cancer Maternal Grandmother   . Cancer Paternal  Grandmother   . Cancer Paternal Grandfather     Social History Social History  Substance Use Topics  . Smoking status: Current Every Day Smoker    Packs/day: 0.50    Types: Cigarettes  . Smokeless tobacco: Never Used  . Alcohol use Yes     Allergies   Patient has no known allergies.   Review of Systems Review of Systems  Constitutional: Negative for chills and fever.  Gastrointestinal: Negative for nausea and vomiting.  Endocrine: Negative for polydipsia, polyphagia and polyuria.  Skin:       Abscess  Allergic/Immunologic: Negative for immunocompromised state.  Hematological: Does not bruise/bleed easily.  Psychiatric/Behavioral: The patient is not nervous/anxious.      Physical Exam Updated Vital Signs BP (!) 141/78   Pulse 68   Temp 98.1 F (36.7 C) (Oral)   Resp 18   Ht 5\' 9"  (1.753 m)   Wt 68 kg (150 lb)   SpO2 100%   BMI 22.15 kg/m   Physical Exam  Constitutional: He is oriented to person, place, and time. He appears well-developed and well-nourished. No distress.  HENT:  Head: Normocephalic and atraumatic.  Eyes: Conjunctivae are normal. No scleral icterus.  Neck: Normal range of motion.  Cardiovascular: Normal rate, regular rhythm and intact distal pulses.   Pulmonary/Chest: Effort normal and breath sounds normal.  Abdominal: Soft. He exhibits no distension. There is no tenderness.  Musculoskeletal:  Distal tip of the left long finger is erythematous and tender to palpation.  Edema noted circumferentially however pad of the finger is soft.  Evidence of paronychia along the radial aspect of the nail fold  Lymphadenopathy:    He has no cervical adenopathy.  Neurological: He is alert and oriented to person, place, and time.  Skin: Skin is warm and dry. He is not diaphoretic. There is erythema.  Psychiatric: He has a normal mood and affect.  Nursing note and vitals reviewed.    ED Treatments / Results  Labs (all labs ordered are listed, but only  abnormal results are displayed) Labs Reviewed - No data to display  EKG  EKG Interpretation None       Radiology No results found.  Procedures .Marland Kitchen.Incision and Drainage Date/Time: 07/12/2017 5:20 AM Performed by: Dierdre ForthMUTHERSBAUGH, Aemon Koeller Authorized by: Dierdre ForthMUTHERSBAUGH, Moriya Mitchell   Consent:    Consent obtained:  Verbal   Consent given by:  Patient   Risks discussed:  Bleeding, incomplete drainage and infection   Alternatives discussed:  No treatment Location:    Type:  Abscess   Location:  Upper extremity   Upper extremity location:  Finger   Finger location:  L long finger Pre-procedure details:    Skin preparation:  Betadine Anesthesia (see MAR for exact dosages):    Anesthesia method:  Nerve block   Block anesthetic:  Lidocaine 1% w/o epi (5mL)   Block injection procedure:  Anatomic landmarks identified, introduced needle, incremental injection and negative aspiration for blood   Block outcome:  Anesthesia achieved Procedure type:    Complexity:  Simple Procedure details:    Needle aspiration: no     Incision types:  Single straight   Scalpel blade:  11   Wound management:  Probed and deloculated and irrigated with saline   Drainage:  Bloody and purulent   Drainage amount:  Moderate   Wound treatment:  Wound left open   Packing materials:  None Post-procedure details:    Patient tolerance of procedure:  Tolerated well, no immediate complications   (including critical care time)  Medications Ordered in ED Medications  lidocaine (PF) (XYLOCAINE) 1 % injection 10 mL (10 mLs Intradermal Given 07/12/17 0510)     Initial Impression / Assessment and Plan / ED Course  I have reviewed the triage vital signs and the nursing notes.  Pertinent labs & imaging results that were available during my care of the patient were reviewed by me and considered in my medical decision making (see chart for details).     Patient with paronychia to the left long finger.  I&D without  complication.  Patient discharged home with instructions about warm soaks and Augmentin.  Discussed signs of worsening infection and reason to return.     Final Clinical Impressions(s) / ED Diagnoses   Final diagnoses:  Paronychia of finger of left hand    New Prescriptions Discharge Medication List as of 07/12/2017  5:22 AM    START taking these medications   Details  amoxicillin-clavulanate (AUGMENTIN) 875-125 MG tablet Take 1 tablet by mouth 2 (two) times daily. One po bid x 7 days, Starting Tue 07/12/2017, Print         Ossie Beltran, [place], PA-C 07/12/17 0525    Ward, Layla MawKristen N, DO 07/12/17 0530

## 2017-07-12 NOTE — ED Triage Notes (Signed)
Pt complains of his right ring finger being sore and swollen for two days Pt says he picked some dead skin from that cuticle and the next day it became swollen and sore

## 2017-07-12 NOTE — ED Notes (Signed)
Bed: WTR5 Expected date:  Expected time:  Means of arrival:  Comments: 

## 2017-07-12 NOTE — Discharge Instructions (Signed)
1. Medications: Augmentin, usual home medications 2. Treatment: rest, drink plenty of fluids, warm water soaks 2-3x per day, keep clean with warm soap and water 3. Follow Up: Please followup with your primary doctor in 2-3 days for discussion of your diagnoses and further evaluation after today's visit; if you do not have a primary care doctor use the resource guide provided to find one; Please return to the ER for signs of worsening infection

## 2017-12-08 DIAGNOSIS — Z202 Contact with and (suspected) exposure to infections with a predominantly sexual mode of transmission: Secondary | ICD-10-CM | POA: Insufficient documentation

## 2017-12-08 DIAGNOSIS — F17210 Nicotine dependence, cigarettes, uncomplicated: Secondary | ICD-10-CM | POA: Insufficient documentation

## 2017-12-09 ENCOUNTER — Emergency Department (HOSPITAL_COMMUNITY)
Admission: EM | Admit: 2017-12-09 | Discharge: 2017-12-09 | Disposition: A | Discharge status: Home / Self Care | Payer: Self-pay | Attending: Emergency Medicine | Admitting: Emergency Medicine

## 2017-12-09 ENCOUNTER — Encounter (HOSPITAL_COMMUNITY): Payer: Self-pay

## 2017-12-09 DIAGNOSIS — Z711 Person with feared health complaint in whom no diagnosis is made: Secondary | ICD-10-CM

## 2017-12-09 LAB — URINALYSIS, ROUTINE W REFLEX MICROSCOPIC
BILIRUBIN URINE: NEGATIVE
GLUCOSE, UA: NEGATIVE mg/dL
Hgb urine dipstick: NEGATIVE
Ketones, ur: 5 mg/dL — AB
LEUKOCYTES UA: NEGATIVE
Nitrite: NEGATIVE
PH: 6 (ref 5.0–8.0)
Protein, ur: NEGATIVE mg/dL
Specific Gravity, Urine: 1.019 (ref 1.005–1.030)

## 2017-12-09 LAB — RPR: RPR Ser Ql: NONREACTIVE

## 2017-12-09 LAB — GC/CHLAMYDIA PROBE AMP (~~LOC~~) NOT AT ARMC
Chlamydia: NEGATIVE
Neisseria Gonorrhea: NEGATIVE

## 2017-12-09 LAB — HIV ANTIBODY (ROUTINE TESTING W REFLEX): HIV SCREEN 4TH GENERATION: NONREACTIVE

## 2017-12-09 NOTE — ED Triage Notes (Signed)
Pt wants to be tested for STD's, no sx but his partner has been having a discharge Pt also complains of a lump on the right side of his throat

## 2017-12-09 NOTE — ED Notes (Signed)
Pt called for triage, no response from lobby 
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
## 2017-12-25 ENCOUNTER — Emergency Department (HOSPITAL_COMMUNITY): Payer: Self-pay

## 2017-12-25 ENCOUNTER — Other Ambulatory Visit: Payer: Self-pay

## 2017-12-25 ENCOUNTER — Encounter (HOSPITAL_COMMUNITY): Payer: Self-pay | Admitting: *Deleted

## 2017-12-25 ENCOUNTER — Emergency Department (HOSPITAL_COMMUNITY)
Admission: EM | Admit: 2017-12-25 | Discharge: 2017-12-25 | Disposition: A | Discharge status: Home / Self Care | Payer: Self-pay | Attending: Emergency Medicine | Admitting: Emergency Medicine

## 2017-12-25 DIAGNOSIS — Z720 Tobacco use: Secondary | ICD-10-CM

## 2017-12-25 DIAGNOSIS — K2900 Acute gastritis without bleeding: Secondary | ICD-10-CM | POA: Insufficient documentation

## 2017-12-25 DIAGNOSIS — F1010 Alcohol abuse, uncomplicated: Secondary | ICD-10-CM | POA: Insufficient documentation

## 2017-12-25 DIAGNOSIS — K2970 Gastritis, unspecified, without bleeding: Secondary | ICD-10-CM

## 2017-12-25 DIAGNOSIS — F1910 Other psychoactive substance abuse, uncomplicated: Secondary | ICD-10-CM | POA: Insufficient documentation

## 2017-12-25 DIAGNOSIS — R079 Chest pain, unspecified: Secondary | ICD-10-CM

## 2017-12-25 DIAGNOSIS — F17210 Nicotine dependence, cigarettes, uncomplicated: Secondary | ICD-10-CM | POA: Insufficient documentation

## 2017-12-25 LAB — CBC
HCT: 45.8 % (ref 39.0–52.0)
Hemoglobin: 15.9 g/dL (ref 13.0–17.0)
MCH: 31.8 pg (ref 26.0–34.0)
MCHC: 34.7 g/dL (ref 30.0–36.0)
MCV: 91.6 fL (ref 78.0–100.0)
PLATELETS: 333 10*3/uL (ref 150–400)
RBC: 5 MIL/uL (ref 4.22–5.81)
RDW: 12.3 % (ref 11.5–15.5)
WBC: 8.8 10*3/uL (ref 4.0–10.5)

## 2017-12-25 LAB — COMPREHENSIVE METABOLIC PANEL
ALT: 21 U/L (ref 17–63)
AST: 25 U/L (ref 15–41)
Albumin: 5.2 g/dL — ABNORMAL HIGH (ref 3.5–5.0)
Alkaline Phosphatase: 63 U/L (ref 38–126)
Anion gap: 14 (ref 5–15)
BUN: 13 mg/dL (ref 6–20)
CHLORIDE: 103 mmol/L (ref 101–111)
CO2: 23 mmol/L (ref 22–32)
Calcium: 10.3 mg/dL (ref 8.9–10.3)
Creatinine, Ser: 0.83 mg/dL (ref 0.61–1.24)
Glucose, Bld: 99 mg/dL (ref 65–99)
POTASSIUM: 3.7 mmol/L (ref 3.5–5.1)
Sodium: 140 mmol/L (ref 135–145)
Total Bilirubin: 0.9 mg/dL (ref 0.3–1.2)
Total Protein: 8.9 g/dL — ABNORMAL HIGH (ref 6.5–8.1)

## 2017-12-25 LAB — RAPID URINE DRUG SCREEN, HOSP PERFORMED
AMPHETAMINES: NOT DETECTED
Barbiturates: NOT DETECTED
Benzodiazepines: NOT DETECTED
Cocaine: POSITIVE — AB
OPIATES: NOT DETECTED
Tetrahydrocannabinol: POSITIVE — AB

## 2017-12-25 LAB — I-STAT TROPONIN, ED
TROPONIN I, POC: 0 ng/mL (ref 0.00–0.08)
TROPONIN I, POC: 0 ng/mL (ref 0.00–0.08)

## 2017-12-25 LAB — D-DIMER, QUANTITATIVE (NOT AT ARMC): D DIMER QUANT: 0.4 ug{FEU}/mL (ref 0.00–0.50)

## 2017-12-25 MED ORDER — ASPIRIN 81 MG PO CHEW
324.0000 mg | CHEWABLE_TABLET | Freq: Once | ORAL | Status: AC
  Administered 2017-12-25: 324 mg via ORAL
  Filled 2017-12-25: qty 4

## 2017-12-25 MED ORDER — GI COCKTAIL ~~LOC~~
30.0000 mL | Freq: Once | ORAL | Status: AC
  Administered 2017-12-25: 30 mL via ORAL
  Filled 2017-12-25: qty 30

## 2017-12-25 MED ORDER — OMEPRAZOLE 20 MG PO CPDR: 20.0000 mg | DELAYED_RELEASE_CAPSULE | Freq: Every day | ORAL | 0 refills | Status: AC

## 2017-12-25 NOTE — ED Notes (Signed)
Bed: OZ30WA15 Expected date:  Expected time:  Means of arrival:  Comments: Held for Res A

## 2017-12-25 NOTE — ED Provider Notes (Addendum)
[facility]-EMERGENCY DEPT Provider Note   CSN: [id number] Arrival date & time: 12/25/17  1515     History   Chief Complaint Chief Complaint  Patient presents with  . Chest Pain    HPI Manuel Kim is a 27 y.o. male who presents the emergency department chief complaint of chest pain and dizziness.  Patient states that he had 2 beers this morning and had onset of sudden pressure-like 6 out of 10 retrosternal chest pain.  He states that he felt extremely dizzy like he was going to pass out.  He tried showering which did not improve his symptoms.  Patient states that he got in the car and drove over here to the hospital and when he got to the parking lot could make it into the building and called 911.  He has never had anything like this before.  He denies chronic NSAID use.  He does drink heavily and has had increase in symptoms of acid reflux.  He denies cocaine abuse but does use alcohol.  He denies nausea or vomiting but had severe diaphoresis.  He denies abdominal pain is a fragile diabetic is admitted is 1212 for asthma.  The patient alcohol  HPI  Past Medical History:  Diagnosis Date  . Trichomonas infection     There are no active problems to display for this patient.   History reviewed. No pertinent surgical history.      Home Medications    Prior to Admission medications   Medication Sig Start Date End Date Taking? Authorizing Provider  amoxicillin-clavulanate (AUGMENTIN) 875-125 MG tablet Take 1 tablet by mouth 2 (two) times daily. One po bid x 7 days 07/12/17   Muthersbaugh, Dahlia Client, PA-C  ibuprofen (ADVIL,MOTRIN) 800 MG tablet Take 1 tablet (800 mg total) by mouth every 6 (six) hours as needed. 04/07/15   Fayrene Helper, PA-C  methocarbamol (ROBAXIN) 500 MG tablet Take 1 tablet (500 mg total) by mouth 2 (two) times daily. 04/07/15   Fayrene Helper, PA-C    Family History Family History  Problem Relation Age of Onset  . Cancer Maternal Grandmother     . Cancer Paternal Grandmother   . Cancer Paternal Grandfather     Social History Social History   Tobacco Use  . Smoking status: Current Every Day Smoker    Packs/day: 0.50    Types: Cigarettes  . Smokeless tobacco: Never Used  Substance Use Topics  . Alcohol use: Yes  . Drug use: Yes     Allergies   Patient has no known allergies.   Review of Systems Review of Systems  Ten systems reviewed and are negative for acute change, except as noted in the HPI.   Physical Exam Updated Vital Signs BP (!) 156/85   Pulse (!) 107   Resp 11   Ht 5\' 9"  (1.753 m)   Wt 65.8 kg (145 lb)   SpO2 94%   BMI 21.41 kg/m   Physical Exam  Constitutional: He is oriented to person, place, and time. He appears well-developed and well-nourished. No distress.  HENT:  Head: Normocephalic and atraumatic.  Eyes: Conjunctivae are normal. No scleral icterus.  Neck: Normal range of motion. Neck supple.  Cardiovascular: Normal rate, regular rhythm and normal heart sounds.  Pulmonary/Chest: Effort normal and breath sounds normal. No respiratory distress.  Abdominal: Soft. There is no tenderness.  Musculoskeletal: He exhibits no edema.  Neurological: He is alert and oriented to person, place, and time.  Skin: Skin is warm.  He is diaphoretic. There is pallor.  Psychiatric: His behavior is normal.  anxious  Nursing note and vitals reviewed.    ED Treatments / Results  Labs (all labs ordered are listed, but only abnormal results are displayed) Labs Reviewed  CBC  D-DIMER, QUANTITATIVE (NOT AT [facility])  COMPREHENSIVE METABOLIC PANEL  I-STAT TROPONIN, ED  I-STAT TROPONIN, ED    EKG None  Radiology No results found.  Procedures Procedures (including critical care time)  Medications Ordered in ED Medications  aspirin chewable tablet 324 mg (324 mg Oral Given by Other 12/25/17 1533)     Initial Impression / Assessment and Plan / ED Course  I have reviewed the triage vital signs and  the nursing notes.  Pertinent labs & imaging results that were available during my care of the patient were reviewed by me and considered in my medical decision making (see chart for details).  Clinical Course as of Dec 25 1929  Sun Dec 25, 2017  [id number] Patient urine cocaine positive. He denied use at initial hpi.   COCAINE(!): POSITIVE [AH]  1856 Patient admits to taking Molly earlier this morning.   [AH]    Clinical Course User Index [AH] Arthor CaptainHarris, Trenyce Loera, PA-C    Patient with 2- troponins.  He has had no recurrence of symptoms and has complete resolution of any chest pain. I suspect a component of gastritis secondary to his alcohol abuse and tobacco abuse.  Patient also abusing mildly.  He is advised to discontinue all of these immediately as they could increase his chance for true MI.  Patient will be started on a PPI. Discussed return precautions  Final Clinical Impressions(s) / ED Diagnoses   Final diagnoses:  Chest pain, unspecified type  Drug abuse (HCC)  Alcohol abuse  Gastritis without bleeding, unspecified chronicity, unspecified gastritis type  Tobacco abuse    ED Discharge Orders    None       Arthor CaptainHarris, Ariannie Penaloza, PA-C 12/25/17 Marton Redwood1935    Allen, Anthony, MD 12/25/17 2318    Arthor CaptainHarris, Dolly Harbach, PA-C 01/07/18 0703    Lorre NickAllen, Anthony, MD 01/08/18 [phone]

## 2017-12-25 NOTE — Discharge Instructions (Addendum)
You should stop abusing alcohol and drugs immediately.  This is likely the cause of your chest pain today. You should stop smoking cigarettes immediately.  I give any paperwork to help you stop smoking. I suspect there is a component of inflammation of your gastric lining from chronic alcohol abuse and cigarette abuse.  This is probably why you had the abdominal pain today.  Stop drinking and smoking.  Begin using the acid reducing medication I have ordered.  Stopping these behaviors will greatly reduce your risk of ever having a real heart attack in the future.   Your caregiver has diagnosed you as having chest pain that is not specific for one problem, but does not require admission.  You are at low risk for an acute heart condition or other serious illness. Chest pain comes from many different causes.  SEEK IMMEDIATE MEDICAL ATTENTION IF: You have severe chest pain, especially if the pain is crushing or pressure-like and spreads to the arms, back, neck, or jaw, or if you have sweating, nausea (feeling sick to your stomach), or shortness of breath. THIS IS AN EMERGENCY. Don't wait to see if the pain will go away. Get medical help at once. Call 911 or 0 (operator). DO NOT drive yourself to the hospital.  Your chest pain gets worse and does not go away with rest.  You have an attack of chest pain lasting longer than usual, despite rest and treatment with the medications your caregiver has prescribed.  You wake from sleep with chest pain or shortness of breath.  You feel dizzy or faint.  You have chest pain not typical of your usual pain for which you originally saw your caregiver.
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
## 2018-03-05 ENCOUNTER — Emergency Department (HOSPITAL_COMMUNITY): Payer: No Typology Code available for payment source

## 2018-03-05 ENCOUNTER — Emergency Department (HOSPITAL_COMMUNITY)
Admission: EM | Admit: 2018-03-05 | Discharge: 2018-03-05 | Disposition: A | Discharge status: Home / Self Care | Payer: No Typology Code available for payment source | Attending: Emergency Medicine | Admitting: Emergency Medicine

## 2018-03-05 ENCOUNTER — Encounter (HOSPITAL_COMMUNITY): Payer: Self-pay | Admitting: Emergency Medicine

## 2018-03-05 DIAGNOSIS — Y939 Activity, unspecified: Secondary | ICD-10-CM | POA: Diagnosis not present

## 2018-03-05 DIAGNOSIS — M545 Low back pain: Secondary | ICD-10-CM | POA: Diagnosis present

## 2018-03-05 DIAGNOSIS — Y999 Unspecified external cause status: Secondary | ICD-10-CM | POA: Insufficient documentation

## 2018-03-05 DIAGNOSIS — F17210 Nicotine dependence, cigarettes, uncomplicated: Secondary | ICD-10-CM | POA: Insufficient documentation

## 2018-03-05 DIAGNOSIS — M549 Dorsalgia, unspecified: Secondary | ICD-10-CM | POA: Diagnosis not present

## 2018-03-05 DIAGNOSIS — Z79899 Other long term (current) drug therapy: Secondary | ICD-10-CM | POA: Diagnosis not present

## 2018-03-05 DIAGNOSIS — Y929 Unspecified place or not applicable: Secondary | ICD-10-CM | POA: Insufficient documentation

## 2018-03-05 MED ORDER — CYCLOBENZAPRINE HCL 10 MG PO TABS: 10.0000 mg | ORAL_TABLET | Freq: Two times a day (BID) | ORAL | 0 refills | Status: AC | PRN
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
## 2018-03-18 ENCOUNTER — Emergency Department (HOSPITAL_COMMUNITY): Payer: Self-pay

## 2018-03-18 ENCOUNTER — Other Ambulatory Visit: Payer: Self-pay

## 2018-03-18 ENCOUNTER — Encounter (HOSPITAL_COMMUNITY): Payer: Self-pay

## 2018-03-18 ENCOUNTER — Emergency Department (HOSPITAL_COMMUNITY)
Admission: EM | Admit: 2018-03-18 | Discharge: 2018-03-18 | Disposition: A | Discharge status: Home / Self Care | Payer: Self-pay | Attending: Emergency Medicine | Admitting: Emergency Medicine

## 2018-03-18 DIAGNOSIS — Z79899 Other long term (current) drug therapy: Secondary | ICD-10-CM | POA: Insufficient documentation

## 2018-03-18 DIAGNOSIS — R5383 Other fatigue: Secondary | ICD-10-CM | POA: Insufficient documentation

## 2018-03-18 DIAGNOSIS — F17210 Nicotine dependence, cigarettes, uncomplicated: Secondary | ICD-10-CM | POA: Insufficient documentation

## 2018-03-18 DIAGNOSIS — M2550 Pain in unspecified joint: Secondary | ICD-10-CM | POA: Insufficient documentation

## 2018-03-18 DIAGNOSIS — M549 Dorsalgia, unspecified: Secondary | ICD-10-CM | POA: Insufficient documentation

## 2018-03-18 DIAGNOSIS — R599 Enlarged lymph nodes, unspecified: Secondary | ICD-10-CM | POA: Insufficient documentation

## 2018-03-18 DIAGNOSIS — N503 Cyst of epididymis: Secondary | ICD-10-CM | POA: Insufficient documentation

## 2018-03-18 DIAGNOSIS — F419 Anxiety disorder, unspecified: Secondary | ICD-10-CM | POA: Insufficient documentation

## 2018-03-18 DIAGNOSIS — N5089 Other specified disorders of the male genital organs: Secondary | ICD-10-CM

## 2018-03-18 LAB — COMPREHENSIVE METABOLIC PANEL
ALBUMIN: 4.8 g/dL (ref 3.5–5.0)
ALK PHOS: 58 U/L (ref 38–126)
ALT: 16 U/L (ref 0–44)
ANION GAP: 15 (ref 5–15)
AST: 22 U/L (ref 15–41)
BUN: 9 mg/dL (ref 6–20)
CALCIUM: 9.5 mg/dL (ref 8.9–10.3)
CO2: 22 mmol/L (ref 22–32)
Chloride: 100 mmol/L (ref 98–111)
Creatinine, Ser: 0.76 mg/dL (ref 0.61–1.24)
GFR calc non Af Amer: >60 mL/min (ref >60)
GLUCOSE: 101 mg/dL — AB (ref 70–99)
POTASSIUM: 3.8 mmol/L (ref 3.5–5.1)
SODIUM: 137 mmol/L (ref 135–145)
Total Bilirubin: 0.7 mg/dL (ref 0.3–1.2)
Total Protein: 8 g/dL (ref 6.5–8.1)

## 2018-03-18 LAB — CBC WITH DIFFERENTIAL/PLATELET
BASOS PCT: 0 %
Basophils Absolute: 0 10*3/uL (ref 0.0–0.1)
EOS ABS: 0 10*3/uL (ref 0.0–0.7)
EOS PCT: 0 %
HCT: 47 % (ref 39.0–52.0)
HEMOGLOBIN: 16.5 g/dL (ref 13.0–17.0)
LYMPHS ABS: 2.2 10*3/uL (ref 0.7–4.0)
Lymphocytes Relative: 22 %
MCH: 32.2 pg (ref 26.0–34.0)
MCHC: 35.1 g/dL (ref 30.0–36.0)
MCV: 91.6 fL (ref 78.0–100.0)
MONO ABS: 0.4 10*3/uL (ref 0.1–1.0)
MONOS PCT: 4 %
NEUTROS PCT: 74 %
Neutro Abs: 7.4 10*3/uL (ref 1.7–7.7)
PLATELETS: 306 10*3/uL (ref 150–400)
RBC: 5.13 MIL/uL (ref 4.22–5.81)
RDW: 12 % (ref 11.5–15.5)
WBC: 10 10*3/uL (ref 4.0–10.5)

## 2018-03-18 NOTE — ED Provider Notes (Signed)
[facility]-EMERGENC[facility] DEPT Provider Note   CSN: [id number] Arrival date & time: 03/18/18  4098     History   Chief Complaint Chief Complaint  Patient presents with  . Groin Swelling    HPI Manuel Kim is a 27 y.o. male.  HPI Manuel Kim is a 27 y.o. male presents to ED with multiple complaints. Pt states he is worried that he may have cancer.  He states he has been googling his symptoms.  He states that he has had swollen lymph nodes in his neck, axilla, groin areas for years.  He states he always attributed it to STDs, however he was checked recently and was told that he was negative for everything including HIV. States he has had increased joint and back pain.  States that he feels like he does not have as much energy as he did few years ago. States "my liver doesn't feel right."  Denies any fever or chills.  No sore throat.  No penile discharge or urinary symptoms.  He states he also found a mass in his left testicle that is nontender. He does not have pcp.   Past Medical History:  Diagnosis Date  . Trichomonas infection     There are no active problems to display for this patient.   No past surgical history on file.      Home Medications    Prior to Admission medications   Medication Sig Start Date End Date Taking? Authorizing Provider  amoxicillin-clavulanate (AUGMENTIN) 875-125 MG tablet Take 1 tablet by mouth 2 (two) times daily. One po bid x 7 days Patient not taking: Reported on 12/25/2017 07/12/17   Muthersbaugh, Dahlia Client, PA-C  cyclobenzaprine (FLEXERIL) 10 MG tablet Take 1 tablet (10 mg total) by mouth 2 (two) times daily as needed for muscle spasms. 03/05/18   Kellie Shropshire, PA-C  ibuprofen (ADVIL,MOTRIN) 800 MG tablet Take 1 tablet (800 mg total) by mouth every 6 (six) hours as needed. Patient not taking: Reported on 12/25/2017 04/07/15   Fayrene Helper, PA-C  methocarbamol (ROBAXIN) 500 MG tablet Take 1 tablet (500 mg total) by mouth 2 (two)  times daily. Patient not taking: Reported on 12/25/2017 04/07/15   Fayrene Helper, PA-C  omeprazole (PRILOSEC) 20 MG capsule Take 1 capsule (20 mg total) by mouth daily. 12/25/17   Arthor Captain, PA-C    Family History Family History  Problem Relation Age of Onset  . Cancer Maternal Grandmother   . Cancer Paternal Grandmother   . Cancer Paternal Grandfather     Social History Social History   Tobacco Use  . Smoking status: Current Every Day Smoker    Packs/day: 0.50    Types: Cigarettes  . Smokeless tobacco: Never Used  Substance Use Topics  . Alcohol use: Yes  . Drug use: Yes     Allergies   Patient has no known allergies.   Review of Systems Review of Systems  Constitutional: Positive for activity change, appetite change and fatigue. Negative for chills and fever.  Respiratory: Negative for cough, chest tightness and shortness of breath.   Cardiovascular: Negative for chest pain, palpitations and leg swelling.  Gastrointestinal: Negative for abdominal distention, abdominal pain, diarrhea, nausea and vomiting.  Genitourinary: Negative for dysuria, frequency, hematuria, penile pain, penile swelling, scrotal swelling, testicular pain and urgency.  Musculoskeletal: Negative for arthralgias, myalgias, neck pain and neck stiffness.  Skin: Negative for rash.  Allergic/Immunologic: Negative for immunocompromised state.  Neurological: Positive for weakness. Negative for dizziness, light-headedness, numbness  and headaches.  Hematological: Positive for adenopathy.  All other systems reviewed and are negative.    Physical Exam Updated Vital Signs BP (!) 157/80 (BP Location: Left Arm)   Pulse (!) 145   Temp 98.8 F (37.1 C) (Oral)   Resp 18   SpO2 97%   Physical Exam  Constitutional: He appears well-developed and well-nourished. No distress.  HENT:  Head: Normocephalic and atraumatic.  Eyes: Conjunctivae are normal.  Neck: Neck supple.  Cardiovascular: Normal rate,  regular rhythm and normal heart sounds.  Pulmonary/Chest: Effort normal. No respiratory distress. He has no wheezes. He has no rales.  Abdominal: Soft. Bowel sounds are normal. He exhibits no distension. There is no tenderness. There is no rebound.  Genitourinary: Penis normal.  Genitourinary Comments: Normal scrotum  Musculoskeletal: He exhibits no edema.  Lymphadenopathy:    He has no cervical adenopathy.  Neurological: He is alert.  Skin: Skin is warm and dry.  Nursing note and vitals reviewed.    ED Treatments / Results  Labs (all labs ordered are listed, but only abnormal results are displayed) Labs Reviewed  COMPREHENSIVE METABOLIC PANEL - Abnormal; Notable for the following components:      Result Value   Glucose, Bld 101 (*)    All other components within normal limits  CBC WITH DIFFERENTIAL/PLATELET    EKG None  Radiology US Scrotum W/doppler  Result Date: 03/18/2018 CLINICAL DATA:  Lumps in testicle for years. EXAM: SCROTAL ULTRASOUND DOPPLER ULTRASOUND OF THE TESTICLES TECHNIQUE: Complete ultrasound examination of the testicles, epididymis, and other scrotal structures was performed. Color and spectral Doppler ultrasound were also utilized to evaluate blood flow to the testicles. COMPARISON:  None. FINDINGS: Right testicle Measurements: 3.8 x 2.1 x 3.4 cm. No mass or microlithiasis visualized. Left testicle Measurements: 4.4 x 2.3 x 2.9 cm. No mass or microlithiasis visualized. Right epididymis:  Normal in size and appearance. Left epididymis:  There is a 6 mm cyst in the left epididymal head. Hydrocele:  None visualized. Varicocele:  None visualized. Pulsed Doppler interrogation of both testes demonstrates normal low resistance arterial and venous waveforms bilaterally. IMPRESSION: 1. 6 mm cyst in the left epididymal head of no significance. 2. No other abnormalities. Electronically Signed   By: Gerome Sam III M.D   On: 03/18/2018 09:04    Procedures Procedures  (including critical care time)  Medications Ordered in ED Medications - No data to display   Initial Impression / Assessment and Plan / ED Course  I have reviewed the triage vital signs and the nursing notes.  Pertinent labs & imaging results that were available during my care of the patient were reviewed by me and considered in my medical decision making (see chart for details).     Pt is very anxious. No concerning findings on exam, no lymphadenopathy. Scrotum unremarkable as well. But is convinced he has a mass in his testicle, will get [place]. Will check cbc and cmp.   9:18 AM Labs are normal.  Ultrasound shows left epididymal cyst, otherwise normal scrotal ultrasound.  Patient reassured.  Heart rate improved.  We will have him follow-up with primary care doctor for further evaluation of his symptoms.  Patient requested his cyst to be removed, explained to him we do not do that in emergency department but will give him referral to urology.  Return precautions discussed.  Vitals:   03/18/18 0737 03/18/18 0854  BP: (!) 157/80 (!) 146/76  Pulse: (!) 145 99  Resp: 18 18  Temp: 98.8 F (37.1 C)   TempSrc: Oral   SpO2: 97% 97%     Final Clinical Impressions(s) / ED Diagnoses   Final diagnoses:  Epididymal cyst    ED Discharge Orders    None       Jaynie CrumbleKirichenko, Karalee Hauter, PA-C 03/18/18 [id number]    Jacalyn LefevreHaviland, Julie, MD 03/18/18 [phone]

## 2018-03-18 NOTE — ED Triage Notes (Signed)
He tells me he has multiple "lumps" and wants to be checked for cancer. He also states he has had recent STD testing.

## 2018-03-18 NOTE — Discharge Instructions (Addendum)
Please follow up with family doctor and urology as needed.

## 2018-11-16 IMAGING — DX DG CHEST 1V PORT
1 series · 1 of 1 positions shown · non-contrast
Comparison: None.

CLINICAL DATA: Chest pain.  Near syncope.

EXAM:
PORTABLE CHEST 1 VIEW

[chest ap]
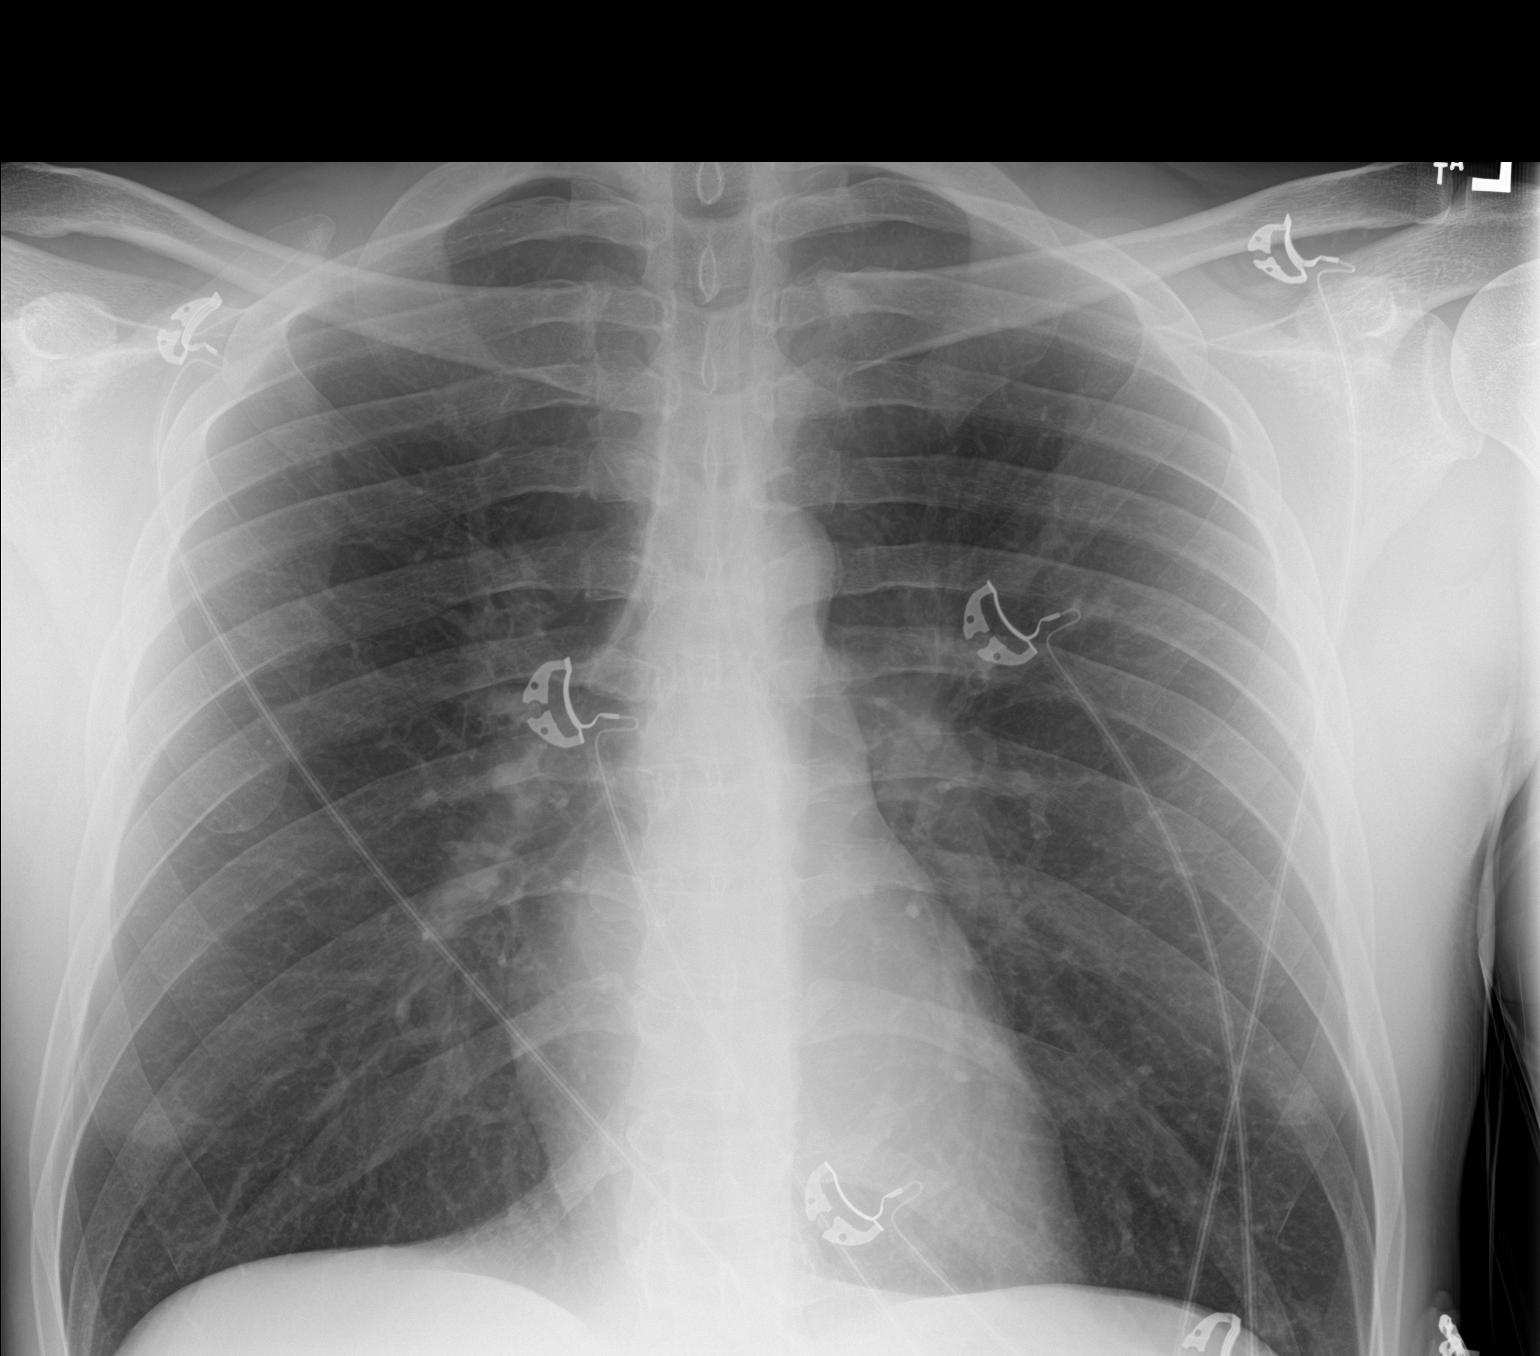

[1 of 1 positions shown; findings below may reference images not displayed]

FINDINGS: The costophrenic angles are excluded from the field of view. The
heart size and mediastinal contours are within normal limits. Both
lungs are clear. Bilateral nipple shadows. The visualized skeletal
structures are unremarkable.
IMPRESSION: No active disease.

## 2019-02-07 IMAGING — US US SCROTUM W/ DOPPLER COMPLETE
1 series · 14 of 25 positions shown · non-contrast
Comparison: None.

CLINICAL DATA: Lumps in testicle for years.

EXAM:
SCROTAL ULTRASOUND
DOPPLER ULTRASOUND OF THE TESTICLES
TECHNIQUE: Complete ultrasound examination of the testicles, epididymis, and
other scrotal structures was performed. Color and spectral Doppler
ultrasound were also utilized to evaluate blood flow to the
testicles.

[Series 1: us scrotum w/ doppler complete · 14 of 73 slices shown]
[im 1/73]
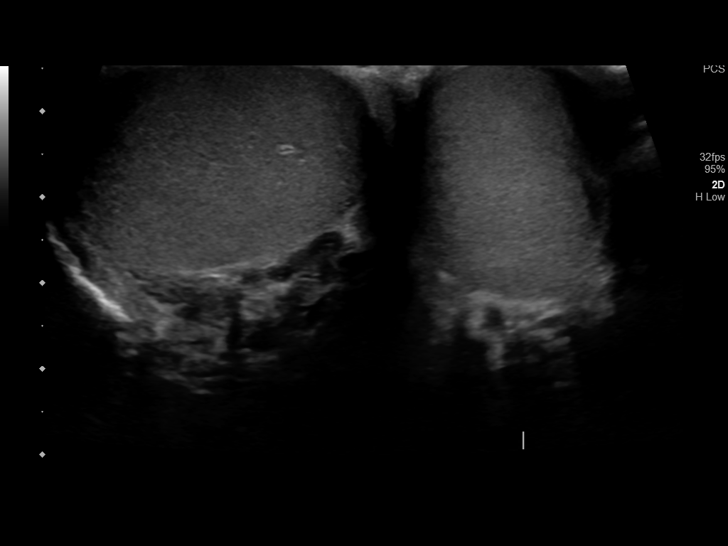
[im 7/73]
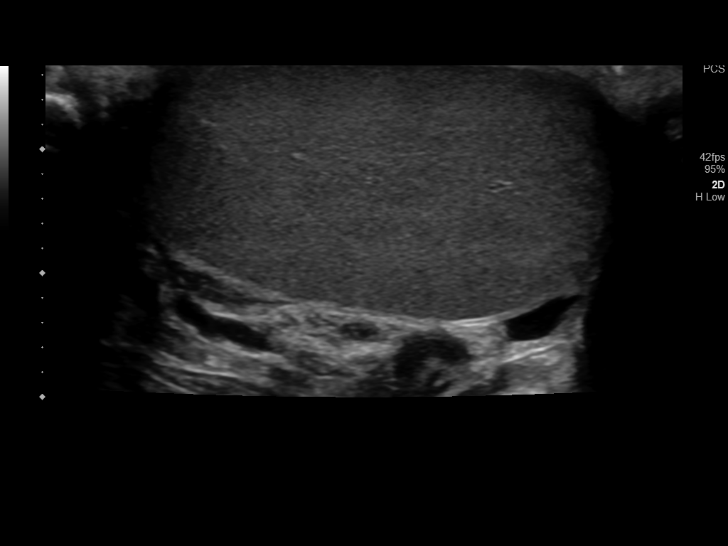
[im 13/73]
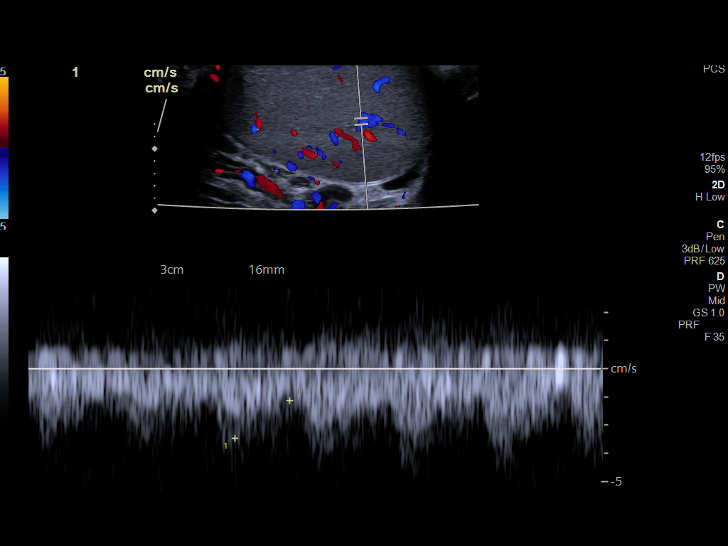
[im 19/73]
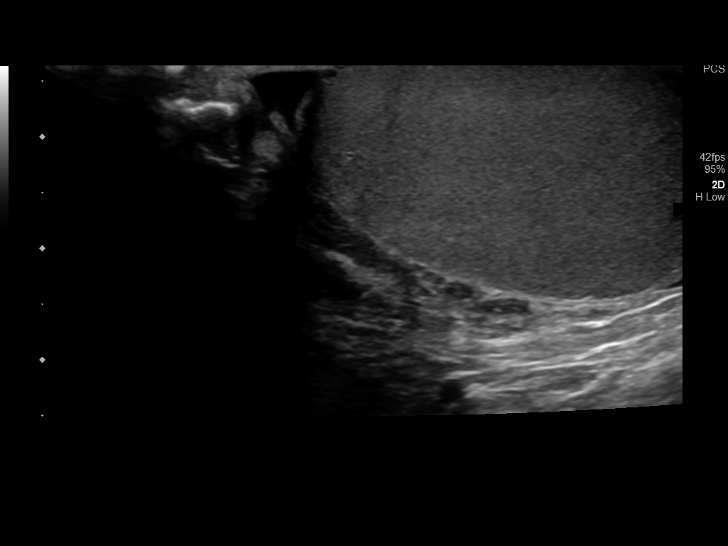
[im 25/73]
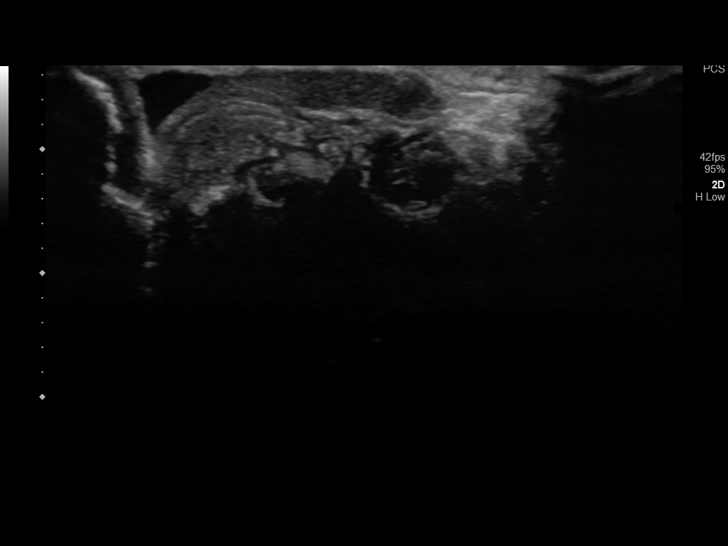
[im 28/73]
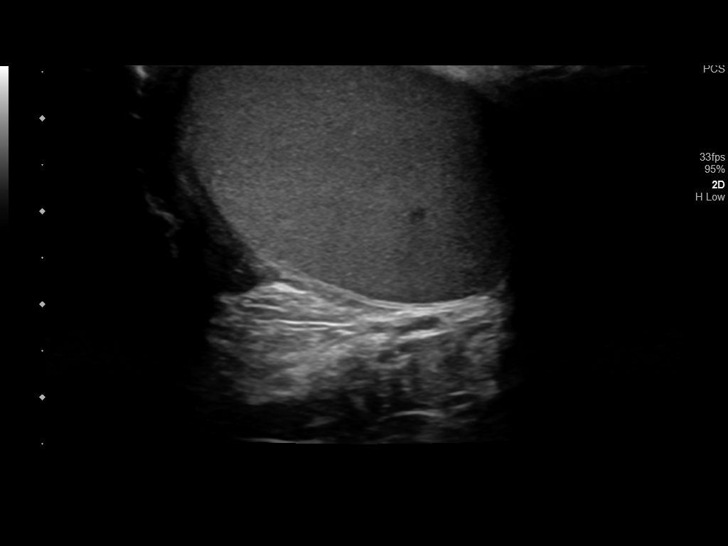
[im 34/73]
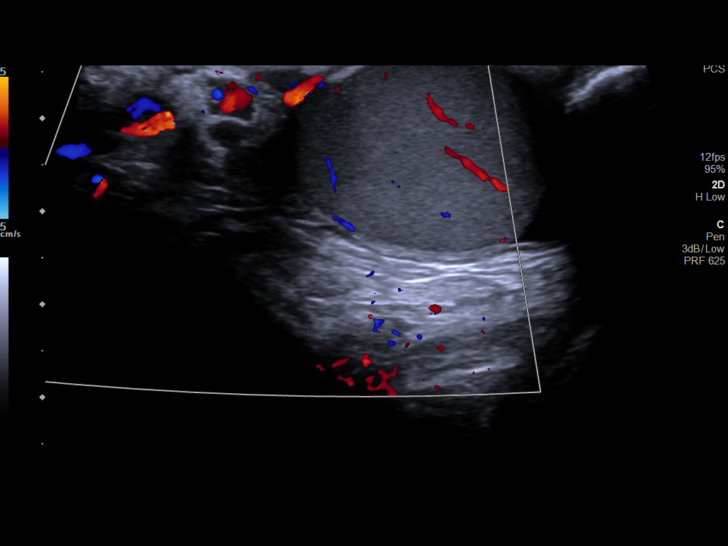
[im 40/73]
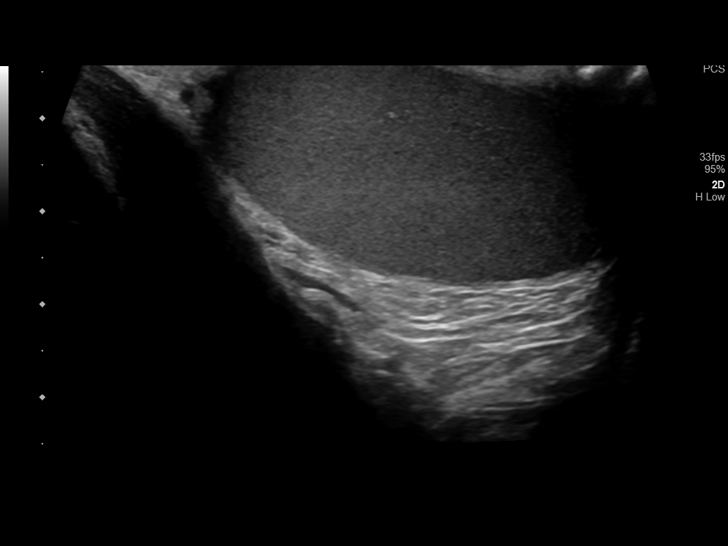
[im 46/73]
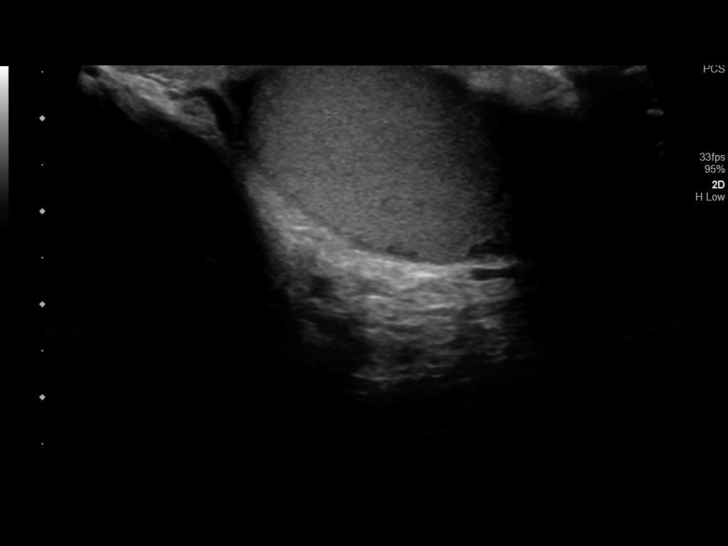
[im 49/73]
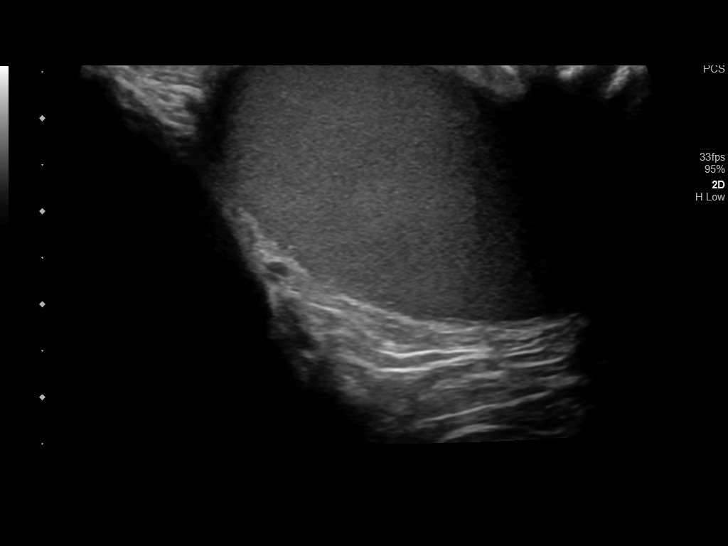
[im 55/73]
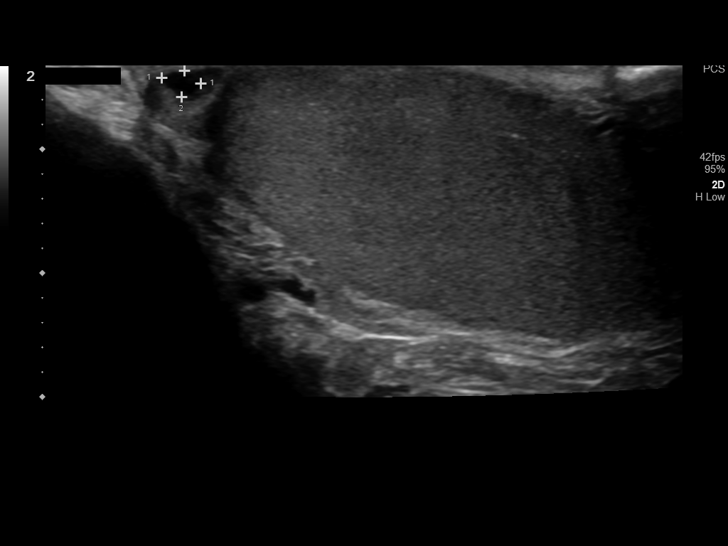
[im 61/73]
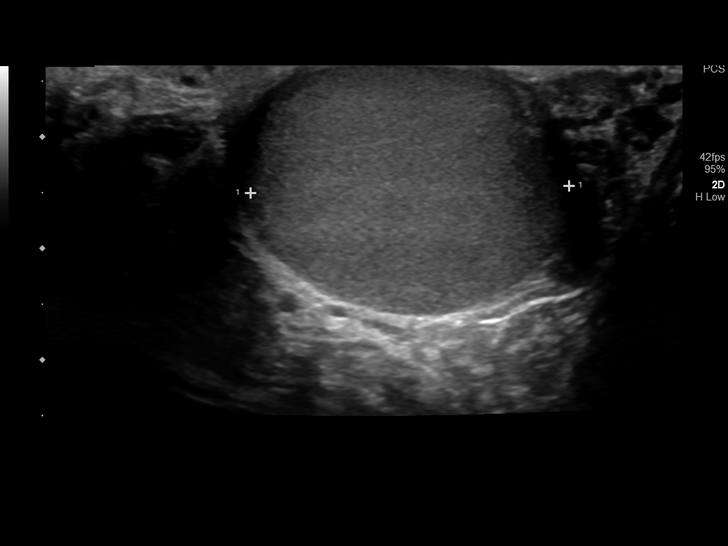
[im 67/73]
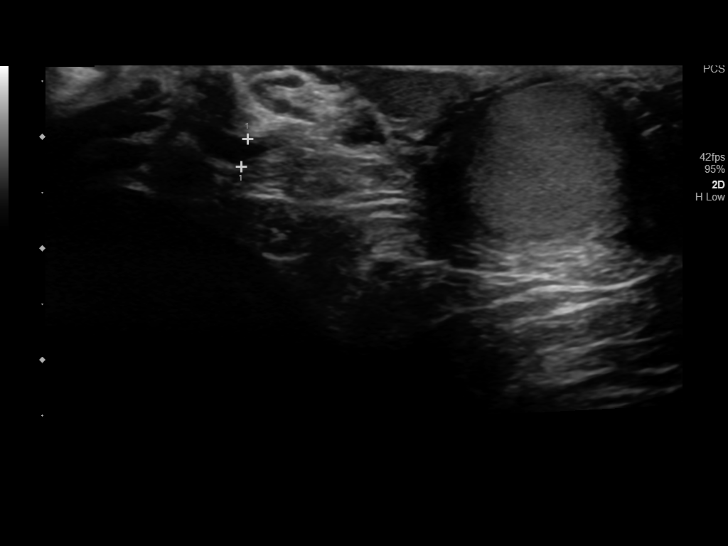
[im 73/73]
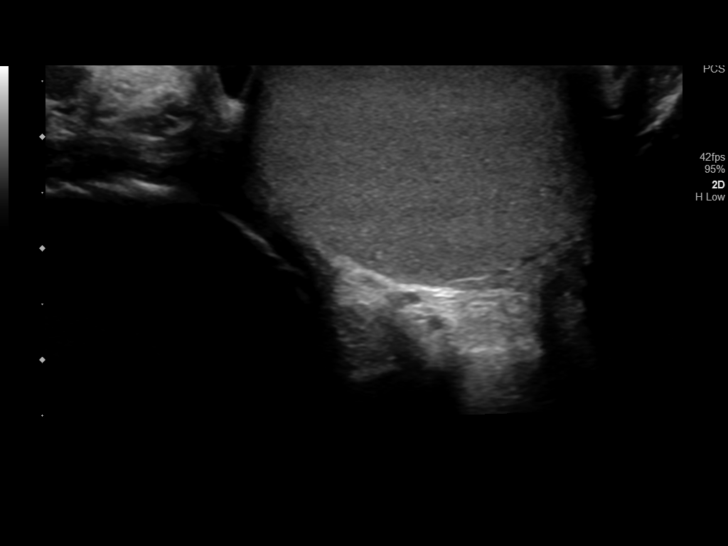

[14 of 25 positions shown; findings below may reference images not displayed]

FINDINGS: Right testicle

Measurements: 3.8 x 2.1 x 3.4 cm. No mass or microlithiasis
visualized.

Left testicle

Measurements: 4.4 x 2.3 x 2.9 cm. No mass or microlithiasis
visualized.

Right epididymis:  Normal in size and appearance.

Left epididymis:  There is a 6 mm cyst in the left epididymal head.

Hydrocele:  None visualized.

Varicocele:  None visualized.

Pulsed Doppler interrogation of both testes demonstrates normal low
resistance arterial and venous waveforms bilaterally.
IMPRESSION: 1. 6 mm cyst in the left epididymal head of no significance.
2. No other abnormalities.

## 2019-03-22 ENCOUNTER — Encounter (HOSPITAL_COMMUNITY): Payer: Self-pay

## 2019-03-22 ENCOUNTER — Other Ambulatory Visit: Payer: Self-pay

## 2019-03-22 ENCOUNTER — Emergency Department (HOSPITAL_COMMUNITY): Payer: Worker's Compensation

## 2019-03-22 ENCOUNTER — Emergency Department (HOSPITAL_COMMUNITY)
Admission: EM | Admit: 2019-03-22 | Discharge: 2019-03-23 | Disposition: A | Discharge status: Home / Self Care | Payer: Worker's Compensation | Attending: Emergency Medicine | Admitting: Emergency Medicine

## 2019-03-22 DIAGNOSIS — W25XXXA Contact with sharp glass, initial encounter: Secondary | ICD-10-CM | POA: Diagnosis not present

## 2019-03-22 DIAGNOSIS — Y9289 Other specified places as the place of occurrence of the external cause: Secondary | ICD-10-CM | POA: Diagnosis not present

## 2019-03-22 DIAGNOSIS — Y990 Civilian activity done for income or pay: Secondary | ICD-10-CM | POA: Diagnosis not present

## 2019-03-22 DIAGNOSIS — Y9389 Activity, other specified: Secondary | ICD-10-CM | POA: Insufficient documentation

## 2019-03-22 DIAGNOSIS — S59901A Unspecified injury of right elbow, initial encounter: Secondary | ICD-10-CM | POA: Diagnosis present

## 2019-03-22 DIAGNOSIS — Z23 Encounter for immunization: Secondary | ICD-10-CM | POA: Diagnosis not present

## 2019-03-22 DIAGNOSIS — Z79899 Other long term (current) drug therapy: Secondary | ICD-10-CM | POA: Insufficient documentation

## 2019-03-22 DIAGNOSIS — S51011A Laceration without foreign body of right elbow, initial encounter: Secondary | ICD-10-CM | POA: Diagnosis not present

## 2019-03-22 DIAGNOSIS — S61214A Laceration without foreign body of right ring finger without damage to nail, initial encounter: Secondary | ICD-10-CM | POA: Diagnosis not present

## 2019-03-22 DIAGNOSIS — F17210 Nicotine dependence, cigarettes, uncomplicated: Secondary | ICD-10-CM | POA: Insufficient documentation

## 2019-03-22 MED ORDER — LIDOCAINE HCL (PF) 1 % IJ SOLN
5.0000 mL | Freq: Once | INTRAMUSCULAR | Status: AC
  Administered 2019-03-22: 5 mL
  Filled 2019-03-22: qty 30

## 2019-03-22 MED ORDER — HYDROCODONE-ACETAMINOPHEN 5-325 MG PO TABS
1.0000 | ORAL_TABLET | Freq: Once | ORAL | Status: AC
  Administered 2019-03-22: 21:00:00 1 via ORAL
  Filled 2019-03-22: qty 1

## 2019-03-22 MED ORDER — CEPHALEXIN 500 MG PO CAPS
500.0000 mg | ORAL_CAPSULE | Freq: Once | ORAL | Status: AC
  Administered 2019-03-23: 500 mg via ORAL
  Filled 2019-03-22: qty 1

## 2019-03-22 MED ORDER — LIDOCAINE-EPINEPHRINE (PF) 2 %-1:200000 IJ SOLN
10.0000 mL | Freq: Once | INTRAMUSCULAR | Status: AC
  Administered 2019-03-22: 10 mL
  Filled 2019-03-22: qty 10

## 2019-03-22 MED ORDER — CEPHALEXIN 500 MG PO CAPS
500.0000 mg | ORAL_CAPSULE | Freq: Four times a day (QID) | ORAL | 0 refills | Status: AC
Start: 2019-03-22 — End: ?

## 2019-03-22 MED ORDER — TETANUS-DIPHTH-ACELL PERTUSSIS 5-2.5-18.5 LF-MCG/0.5 IM SUSP
0.5000 mL | Freq: Once | INTRAMUSCULAR | Status: AC
  Administered 2019-03-22: 21:00:00 0.5 mL via INTRAMUSCULAR
  Filled 2019-03-22: qty 0.5

## 2019-03-22 NOTE — Discharge Instructions (Signed)
The laceration on your elbow was closed with sutures and these will need to be removed in 7 to 10 days.  Keep the area clean and covered, you can shower but do not submerge the elbow underwater, no swimming.  Monitor for any signs of infection such as redness, swelling, drainage or increasing pain.  The laceration on your finger pulled skin away and was not able to be sutured closed.  This will take time to heal.  Keep dressing in place for the next 2 days to help continue to control bleeding.  After this you can remove and replace the dressing, keep the area clean dry and covered.  You will need to call to schedule follow-up appointment with Dr. Fredna Dow with hand surgery.  Please take antibiotics as directed to help prevent infection.  Ibuprofen and Tylenol as needed for pain.

## 2019-03-22 NOTE — ED Triage Notes (Addendum)
Patient was moving a fish tank today and fell into the fish tank. Patient has right ring finger laceration and right elbow laceration. Bleeding is controlled.  patient went to an UC and was given "numbing medication."

## 2019-03-22 NOTE — ED Provider Notes (Signed)
[facility]-EMERGENCY DEPT Provider Note   CSN: [id number] Arrival date & time: 03/22/19  1756    History   Chief Complaint Chief Complaint  Patient presents with  . Finger laceration  . Extremity Laceration    HPI Manuel Kim is a 28 y.o. male.     Manuel Neerevin Bauernfeind is a 28 y.o. male who is otherwise healthy, presents to the emergency department for evaluation of lacerations to the right ring finger and right elbow.  Patient sustained these lacerations while at work, he was helping to move an empty fish tank when he fell into this breaking the glass and causing laceration to the digit pad of the right ring finger as well as to the posterior right elbow.  Dressings in place and bleeding controlled, patient sent from urgent care for further evaluation with concern for possible tendon or nerve injury to the ring finger.  No x-rays performed.  Reports had a hard time getting bleeding stopped with the wound over his finger, and he thinks it may still be bleeding under the bandage.  Unsure of last tetanus vaccination.  He denies any numbness tingling this in the fingers or arm.  Patient reports lidocaine injection given to the finger by urgent care is now starting to wear off and he reports some pain in the finger.  Nothing else for pain prior to arrival.  No other aggravating or alleviating factors.     Past Medical History:  Diagnosis Date  . Trichomonas infection     There are no active problems to display for this patient.   Past Surgical History:  Procedure Laterality Date  . ELBOW FRACTURE SURGERY Left         Home Medications    Prior to Admission medications   Medication Sig Start Date End Date Taking? Authorizing Provider  amoxicillin-clavulanate (AUGMENTIN) 875-125 MG tablet Take 1 tablet by mouth 2 (two) times daily. One po bid x 7 days Patient not taking: Reported on 12/25/2017 07/12/17   Muthersbaugh, Dahlia ClientHannah, PA-C  cephALEXin (KEFLEX) 500 MG  capsule Take 1 capsule (500 mg total) by mouth 4 (four) times daily. 03/22/19   Dartha LodgeFord, Ezekiah Massie N, PA-C  cyclobenzaprine (FLEXERIL) 10 MG tablet Take 1 tablet (10 mg total) by mouth 2 (two) times daily as needed for muscle spasms. 03/05/18   Kellie ShropshireShrosbree, Emily J, PA-C  ibuprofen (ADVIL,MOTRIN) 800 MG tablet Take 1 tablet (800 mg total) by mouth every 6 (six) hours as needed. Patient not taking: Reported on 12/25/2017 04/07/15   Fayrene Helperran, Bowie, PA-C  methocarbamol (ROBAXIN) 500 MG tablet Take 1 tablet (500 mg total) by mouth 2 (two) times daily. Patient not taking: Reported on 12/25/2017 04/07/15   Fayrene Helperran, Bowie, PA-C  omeprazole (PRILOSEC) 20 MG capsule Take 1 capsule (20 mg total) by mouth daily. 12/25/17   Arthor CaptainHarris, Abigail, PA-C    Family History Family History  Problem Relation Age of Onset  . Healthy Mother   . Cancer Maternal Grandmother   . Cancer Paternal Grandmother   . Cancer Paternal Grandfather     Social History Social History   Tobacco Use  . Smoking status: Current Every Day Smoker    Packs/day: 0.50    Types: Cigarettes  . Smokeless tobacco: Never Used  Substance Use Topics  . Alcohol use: Yes  . Drug use: Never     Allergies   Patient has no known allergies.   Review of Systems Review of Systems  Constitutional: Negative for chills and fever.  Musculoskeletal: Positive for arthralgias.  Skin: Positive for wound.  Neurological: Negative for weakness and numbness.     Physical Exam Updated Vital Signs BP (!) 133/96 (BP Location: Left Arm)   Pulse 94   Temp 98.7 F (37.1 C) (Oral)   Resp 16   Ht 5\' 9"  (1.753 m)   Wt 71.7 kg   SpO2 100%   BMI 23.33 kg/m   Physical Exam Vitals signs and nursing note reviewed.  Constitutional:      General: He is not in acute distress.    Appearance: Normal appearance. He is well-developed and normal weight. He is not diaphoretic.  HENT:     Head: Normocephalic and atraumatic.  Eyes:     General:        Right eye: No  discharge.        Left eye: No discharge.  Pulmonary:     Effort: Pulmonary effort is normal. No respiratory distress.  Musculoskeletal:       Arms:  Skin:    General: Skin is warm and dry.  Neurological:     Mental Status: He is alert and oriented to person, place, and time.     Coordination: Coordination normal.  Psychiatric:        Mood and Affect: Mood normal.        Behavior: Behavior normal.        ED Treatments / Results  Labs (all labs ordered are listed, but only abnormal results are displayed) Labs Reviewed - No data to display  EKG None  Radiology Dg Elbow 2 Views Right  Result Date: 03/22/2019 CLINICAL DATA:  Recent fall with laceration, initial encounter EXAM: RIGHT ELBOW - 2 VIEW COMPARISON:  None. FINDINGS: No acute fracture or dislocation is noted. No definitive radiopaque foreign body is noted. No other focal abnormality is seen. IMPRESSION: No acute abnormality noted. Electronically Signed   By: Alcide CleverMark  Lukens M.D.   On: 03/22/2019 20:45   Dg Finger Ring Right  Result Date: 03/22/2019 CLINICAL DATA:  Laceration to the fourth digit, initial encounter EXAM: RIGHT RING FINGER 2+V COMPARISON:  None. FINDINGS: Soft tissue swelling is noted consistent with the recent injury. No definitive radiopaque foreign body is seen. No bony abnormality is noted. IMPRESSION: Soft tissue injury without acute bony abnormality. Electronically Signed   By: Alcide CleverMark  Lukens M.D.   On: 03/22/2019 20:40    Procedures .Marland Kitchen.Laceration Repair  Date/Time: 03/23/2019 1:33 AM Performed by: Dartha LodgeFord, Sharry Beining N, PA-C Authorized by: Dartha LodgeFord, Rudene Poulsen N, PA-C   Consent:    Consent obtained:  Verbal   Consent given by:  Patient   Risks discussed:  Infection, pain, need for additional repair, poor cosmetic result and poor wound healing   Alternatives discussed:  No treatment Anesthesia (see MAR for exact dosages):    Anesthesia method:  Local infiltration   Local anesthetic:  Lidocaine 2% WITH epi  Laceration details:    Location:  Shoulder/arm   Shoulder/arm location:  R elbow   Length (cm):  3   Depth (mm):  3 Repair type:    Repair type:  Simple Pre-procedure details:    Preparation:  Patient was prepped and draped in usual sterile fashion and imaging obtained to evaluate for foreign bodies Exploration:    Hemostasis achieved with:  Direct pressure and epinephrine   Wound exploration: wound explored through full range of motion and entire depth of wound probed and visualized     Wound extent: areolar tissue violated  Wound extent: no foreign bodies/material noted   Treatment:    Area cleansed with:  Saline   Amount of cleaning:  Standard   Irrigation solution:  Sterile saline Skin repair:    Repair method:  Sutures   Suture size:  4-0   Suture material:  Prolene   Suture technique:  Simple interrupted   Number of sutures:  4 Approximation:    Approximation:  Close Post-procedure details:    Dressing:  Antibiotic ointment and adhesive bandage   Patient tolerance of procedure:  Tolerated well, no immediate complications   (including critical care time)  Medications Ordered in ED Medications  cephALEXin (KEFLEX) capsule 500 mg (has no administration in time range)  Tdap (BOOSTRIX) injection 0.5 mL (0.5 mLs Intramuscular Given 03/22/19 2034)  HYDROcodone-acetaminophen (NORCO/VICODIN) 5-325 MG per tablet 1 tablet (1 tablet Oral Given 03/22/19 2033)  lidocaine (PF) (XYLOCAINE) 1 % injection 5 mL (5 mLs Infiltration Given 03/22/19 2036)  lidocaine-EPINEPHrine (XYLOCAINE W/EPI) 2 %-1:200000 (PF) injection 10 mL (10 mLs Infiltration Given 03/22/19 2035)     Initial Impression / Assessment and Plan / ED Course  I have reviewed the triage vital signs and the nursing notes.  Pertinent labs & imaging results that were available during my care of the patient were reviewed by me and considered in my medical decision making (see chart for details).  Patient presents with  lacerations to the right elbow and right ring finger after he fell into a fish tank causing glass to break.  Bleeding controlled with dressings.  Sent from urgent care for further evaluation with concern for possible tendon or nerve injury to the right finger.  X-rays show no evidence of foreign body or fracture.  Tetanus updated.  3 cm laceration to the posterior right elbow closed with simple interrupted sutures with good cosmesis.  Right upper extremity is neurovascularly intact, wound superficial without evidence of tendon or nerve involvement.  Suture removal in 7 to 10 days.  Laceration to the right ring finger is primarily an avulsion injury, not amenable to suture repair.  Actively bleeding.  It was cleaned and digital block performed.  Normal strength and sensation.  Wound seal powder applied with good hemostasis, patient to leave dressing in place for 48 hours afterwards he can carefully change dressing, he should keep wound clean and covered.  Keflex for infection prevention.  Will have patient follow-up with Dr. Fredna Dow with hand surgery, discussed with him that the injury to the right ring finger will likely take a long time to heal as the skin was cut away.  Wound care precautions discussed.  Patient expresses understanding and agreement with plan.  Discharged home in good condition.  Final Clinical Impressions(s) / ED Diagnoses   Final diagnoses:  Laceration of right ring finger without foreign body without damage to nail, initial encounter  Elbow laceration, right, initial encounter    ED Discharge Orders         Ordered    cephALEXin (KEFLEX) 500 MG capsule  4 times daily     03/22/19 2326           Jacqlyn Larsen, PA-C 03/23/19 [place], [place], DO 03/23/19 1517

## 2019-10-21 ENCOUNTER — Ambulatory Visit
Admission: EM | Admit: 2019-10-21 | Discharge: 2019-10-21 | Disposition: A | Discharge status: Home / Self Care | Payer: Self-pay | Attending: Physician Assistant | Admitting: Physician Assistant

## 2019-10-21 ENCOUNTER — Other Ambulatory Visit: Payer: Self-pay

## 2019-10-21 ENCOUNTER — Encounter: Payer: Self-pay | Admitting: Physician Assistant

## 2019-10-21 DIAGNOSIS — H938X2 Other specified disorders of left ear: Secondary | ICD-10-CM

## 2019-10-21 DIAGNOSIS — H6123 Impacted cerumen, bilateral: Secondary | ICD-10-CM

## 2019-10-21 MED ORDER — FLUTICASONE PROPIONATE 50 MCG/ACT NA SUSP: 2.0000 | Freq: Every day | NASAL | 0 refills | Status: AC

## 2019-10-21 NOTE — ED Triage Notes (Signed)
Pt sts decreased hearing from left ear x 5 days

## 2019-10-21 NOTE — ED Provider Notes (Signed)
[facility]    CSN: [id number] Arrival date & time: 10/21/19  1330      History   Chief Complaint Chief Complaint  Patient presents with  . Ear Fullness    HPI Manuel Kim is a 29 y.o. male.   29 year old male comes in for 5-day history of left ear fullness, popping.  Denies ear pain, drainage.  Denies URI symptoms such as cough, congestion, sore throat.  Denies fever, chills, body aches.  Has tried cotton swab without relief.     Past Medical History:  Diagnosis Date  . Trichomonas infection     There are no problems to display for this patient.   Past Surgical History:  Procedure Laterality Date  . ELBOW FRACTURE SURGERY Left        Home Medications    Prior to Admission medications   Medication Sig Start Date End Date Taking? Authorizing Provider  amoxicillin-clavulanate (AUGMENTIN) 875-125 MG tablet Take 1 tablet by mouth 2 (two) times daily. One po bid x 7 days Patient not taking: Reported on 12/25/2017 07/12/17   Muthersbaugh, Dahlia Client, PA-C  cephALEXin (KEFLEX) 500 MG capsule Take 1 capsule (500 mg total) by mouth 4 (four) times daily. Patient not taking: Reported on 10/21/2019 03/22/19   Dartha Lodge, PA-C  cyclobenzaprine (FLEXERIL) 10 MG tablet Take 1 tablet (10 mg total) by mouth 2 (two) times daily as needed for muscle spasms. Patient not taking: Reported on 10/21/2019 03/05/18   Kellie Shropshire, PA-C  fluticasone [facility]) 50 MCG/ACT nasal spray Place 2 sprays into both nostrils daily. 10/21/19   Cathie Hoops, Kaleia Longhi V, PA-C  ibuprofen (ADVIL,MOTRIN) 800 MG tablet Take 1 tablet (800 mg total) by mouth every 6 (six) hours as needed. Patient not taking: Reported on 12/25/2017 04/07/15   Fayrene Helper, PA-C  methocarbamol (ROBAXIN) 500 MG tablet Take 1 tablet (500 mg total) by mouth 2 (two) times daily. Patient not taking: Reported on 12/25/2017 04/07/15   Fayrene Helper, PA-C  omeprazole (PRILOSEC) 20 MG capsule Take 1 capsule (20 mg total) by mouth daily. 12/25/17    Arthor Captain, PA-C    Family History Family History  Problem Relation Age of Onset  . Healthy Mother   . Cancer Maternal Grandmother   . Cancer Paternal Grandmother   . Cancer Paternal Grandfather     Social History Social History   Tobacco Use  . Smoking status: Current Every Day Smoker    Packs/day: 0.50    Types: Cigarettes  . Smokeless tobacco: Never Used  Substance Use Topics  . Alcohol use: Yes  . Drug use: Never     Allergies   Patient has no known allergies.   Review of Systems Review of Systems  Reason unable to perform ROS: See HPI as above.     Physical Exam Triage Vital Signs ED Triage Vitals  Enc Vitals Group     BP 10/21/19 1339 124/86     Pulse Rate 10/21/19 1339 (!) 113     Resp 10/21/19 1339 18     Temp 10/21/19 1339 99.1 F (37.3 C)     Temp Source 10/21/19 1339 Oral     SpO2 10/21/19 1339 99 %     Weight --      Height --      Head Circumference --      Peak Flow --      Pain Score 10/21/19 1341 0     Pain Loc --      Pain  Edu? --      Excl. in [place]? --    No data found.  Updated Vital Signs BP 124/86 (BP Location: Left Arm)   Pulse (!) 113   Temp 99.1 F (37.3 C) (Oral)   Resp 18   SpO2 99%   Physical Exam Constitutional:      General: He is not in acute distress.    Appearance: Normal appearance. He is well-developed. He is not toxic-appearing or diaphoretic.  HENT:     Head: Normocephalic and atraumatic.     Ears:     Comments: No tenderness to palpation of tragus bilaterally.  Bilateral cerumen impaction, TM not visible.  Post ear irrigation: Bilateral TM visible, erythematous without bulging. Eyes:     Conjunctiva/sclera: Conjunctivae normal.     Pupils: Pupils are equal, round, and reactive to light.  Pulmonary:     Effort: Pulmonary effort is normal. No respiratory distress.     Comments: Speaking in full sentences without difficulty Musculoskeletal:     Cervical back: Normal range of motion and neck supple.   Skin:    General: Skin is warm and dry.  Neurological:     Mental Status: He is alert and oriented to person, place, and time.      UC Treatments / Results  Labs (all labs ordered are listed, but only abnormal results are displayed) Labs Reviewed - No data to display  EKG   Radiology No results found.  Procedures Procedures (including critical care time)  Medications Ordered in UC Medications - No data to display  Initial Impression / Assessment and Plan / UC Course  I have reviewed the triage vital signs and the nursing notes.  Pertinent labs & imaging results that were available during my care of the patient were reviewed by me and considered in my medical decision making (see chart for details).    Patient tolerated ear irrigation well. TM visible without otitis media. Discussed using flonase/nasacort if continues with symptoms. Return precautions given.   Final Clinical Impressions(s) / UC Diagnoses   Final diagnoses:  Sensation of fullness in left ear  Bilateral impacted cerumen    ED Prescriptions    Medication Sig Dispense Auth. Provider   fluticasone (FLONASE) 50 MCG/ACT nasal spray Place 2 sprays into both nostrils daily. 1 g Ok Edwards, PA-C     PDMP not reviewed this encounter.   Ok Edwards, PA-C 10/21/19 1421

## 2019-10-21 NOTE — Discharge Instructions (Signed)
Ear wax removed today. If continue to have ear fullness, can try over the counter flonase/nasacort for possible eustachian tube dysfunction causing symptoms. Follow up for reevaluation if symptoms not improving, having pain.  

## 2019-11-22 ENCOUNTER — Encounter (HOSPITAL_COMMUNITY): Payer: Self-pay | Admitting: Emergency Medicine

## 2019-11-22 ENCOUNTER — Other Ambulatory Visit: Payer: Self-pay

## 2019-11-22 ENCOUNTER — Emergency Department (HOSPITAL_COMMUNITY)
Admission: EM | Admit: 2019-11-22 | Discharge: 2019-11-23 | Disposition: A | Discharge status: Home / Self Care | Payer: Self-pay | Attending: Emergency Medicine | Admitting: Emergency Medicine

## 2019-11-22 DIAGNOSIS — S60022A Contusion of left index finger without damage to nail, initial encounter: Secondary | ICD-10-CM | POA: Insufficient documentation

## 2019-11-22 DIAGNOSIS — W230XXA Caught, crushed, jammed, or pinched between moving objects, initial encounter: Secondary | ICD-10-CM | POA: Insufficient documentation

## 2019-11-22 DIAGNOSIS — N50811 Right testicular pain: Secondary | ICD-10-CM | POA: Insufficient documentation

## 2019-11-22 DIAGNOSIS — F17210 Nicotine dependence, cigarettes, uncomplicated: Secondary | ICD-10-CM | POA: Insufficient documentation

## 2019-11-22 DIAGNOSIS — Y9389 Activity, other specified: Secondary | ICD-10-CM | POA: Insufficient documentation

## 2019-11-22 DIAGNOSIS — Z79899 Other long term (current) drug therapy: Secondary | ICD-10-CM | POA: Insufficient documentation

## 2019-11-22 DIAGNOSIS — Y999 Unspecified external cause status: Secondary | ICD-10-CM | POA: Insufficient documentation

## 2019-11-22 DIAGNOSIS — Y9289 Other specified places as the place of occurrence of the external cause: Secondary | ICD-10-CM | POA: Insufficient documentation

## 2019-11-22 NOTE — Discharge Instructions (Addendum)
Your hand injury does not appear to be a broken bone or significant wound. Take Tylenol and/or ibuprofen for any discomfort.   As we discussed, you need to establish with a primary care provider that can evaluate your concern about testicular pain in the outpatient setting. Resources have been provided for available primary medical care.

## 2019-11-22 NOTE — ED Triage Notes (Signed)
Patient reports shutting left hand in door x40 minutes ago. C/o pain to left index and left middle finger with abrasions.

## 2019-11-22 NOTE — ED Provider Notes (Signed)
[facility] DEPT Provider Note   CSN: [id number] Arrival date & time: 11/22/19  2257     History Chief Complaint  Patient presents with  . Laceration    Manuel Kim is a 29 y.o. male.  Patient to ED with complaint of left index and middle finger pain after slamming his hand in a car door just prior to arrival. He also complains of feeling a mass on his right testicle. He reports the mass has been there for 4 years. Sometimes it is painful and sometimes it is nonpainful. No difficulty with ejaculation.   The history is provided by the patient. No language interpreter was used.  Laceration      Past Medical History:  Diagnosis Date  . Trichomonas infection     There are no problems to display for this patient.   Past Surgical History:  Procedure Laterality Date  . ELBOW FRACTURE SURGERY Left        Family History  Problem Relation Age of Onset  . Healthy Mother   . Cancer Maternal Grandmother   . Cancer Paternal Grandmother   . Cancer Paternal Grandfather     Social History   Tobacco Use  . Smoking status: Current Every Day Smoker    Packs/day: 0.50    Types: Cigarettes  . Smokeless tobacco: Never Used  Substance Use Topics  . Alcohol use: Yes  . Drug use: Never    Home Medications Prior to Admission medications   Medication Sig Start Date End Date Taking? Authorizing Provider  amoxicillin-clavulanate (AUGMENTIN) 875-125 MG tablet Take 1 tablet by mouth 2 (two) times daily. One po bid x 7 days Patient not taking: Reported on 12/25/2017 07/12/17   Muthersbaugh, Jarrett Soho, PA-C  cephALEXin (KEFLEX) 500 MG capsule Take 1 capsule (500 mg total) by mouth 4 (four) times daily. Patient not taking: Reported on 10/21/2019 03/22/19   Jacqlyn Larsen, PA-C  cyclobenzaprine (FLEXERIL) 10 MG tablet Take 1 tablet (10 mg total) by mouth 2 (two) times daily as needed for muscle spasms. Patient not taking: Reported on 10/21/2019 03/05/18   Glyn Ade, PA-C  fluticasone [facility]) 50 MCG/ACT nasal spray Place 2 sprays into both nostrils daily. 10/21/19   Tasia Catchings, Amy V, PA-C  ibuprofen (ADVIL,MOTRIN) 800 MG tablet Take 1 tablet (800 mg total) by mouth every 6 (six) hours as needed. Patient not taking: Reported on 12/25/2017 04/07/15   Domenic Moras, PA-C  methocarbamol (ROBAXIN) 500 MG tablet Take 1 tablet (500 mg total) by mouth 2 (two) times daily. Patient not taking: Reported on 12/25/2017 04/07/15   Domenic Moras, PA-C  omeprazole (PRILOSEC) 20 MG capsule Take 1 capsule (20 mg total) by mouth daily. 12/25/17   Margarita Mail, PA-C    Allergies    Patient has no known allergies.  Review of Systems   Review of Systems  Genitourinary: Negative for hematuria.       See HPI.  Musculoskeletal:       See HPI.  Skin: Negative for color change and wound.  Neurological: Negative for numbness.    Physical Exam Updated Vital Signs BP (!) 161/96 (BP Location: Left Arm)   Pulse (!) 106   Temp 98.1 F (36.7 C) (Oral)   Resp 16   SpO2 100%   Physical Exam Constitutional:      Appearance: He is well-developed.  Pulmonary:     Effort: Pulmonary effort is normal.  Genitourinary:    Comments: There is no testicular tenderness or  palpable mass on exam. No penile discharge. No scrotal swelling or discoloration.  Musculoskeletal:        General: Normal range of motion.     Cervical back: Normal range of motion.     Comments: No discoloration or tenderness to left index or middle fingers. No wound. FROM.  Skin:    General: Skin is warm and dry.  Neurological:     Mental Status: He is alert and oriented to person, place, and time.     ED Results / Procedures / Treatments   Labs (all labs ordered are listed, but only abnormal results are displayed) Labs Reviewed - No data to display  EKG None  Radiology No results found.  Procedures Procedures (including critical care time)  Medications Ordered in ED Medications - No data to  display  ED Course  I have reviewed the triage vital signs and the nursing notes.  Pertinent labs & imaging results that were available during my care of the patient were reviewed by me and considered in my medical decision making (see chart for details).    MDM Rules/Calculators/A&P                      Patient to ED with c/o left index and middle fingers injury by slamming it in a car door. After triage and while in the room, he reports concern for testicular mass x 4 years.   There is no finger discoloration, no tenderness to suggest bony injury.   There is no mass palpated on testicular exam. He reports he has had symptoms x 4 years. Discussed the importance of outpatient follow up with primary care provider. Resources provided.  Final Clinical Impression(s) / ED Diagnoses Final diagnoses:  None   1. Finger contusion 2. Testicular pain  Rx / DC Orders ED Discharge Orders    None       Elpidio Anis, PA-C 11/22/19 2353    Gilda Crease, MD 11/23/19 [phone]

## 2019-11-23 NOTE — ED Notes (Signed)
Pt A/O and ambulatory at discharge. PT has full movement of all extremities. Pt denies any questions and verbalizes discharge instructions.

## 2020-02-11 IMAGING — CR RIGHT RING FINGER 2+V
3 series · 3 of 3 positions shown · non-contrast
Comparison: None.

CLINICAL DATA: Laceration to the fourth digit, initial encounter

EXAM:
RIGHT RING FINGER 2+V

[x finger pa right]
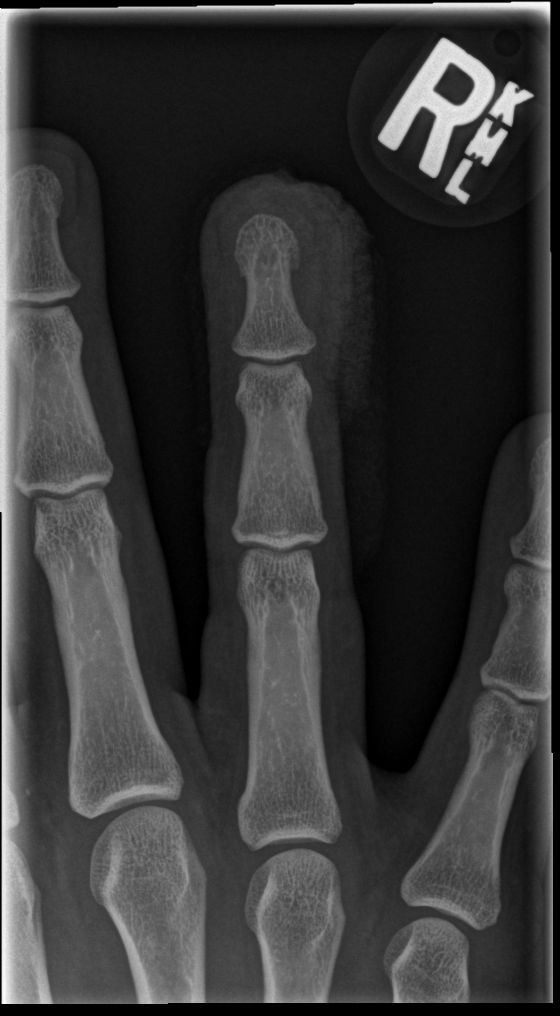

[x finger obl right]
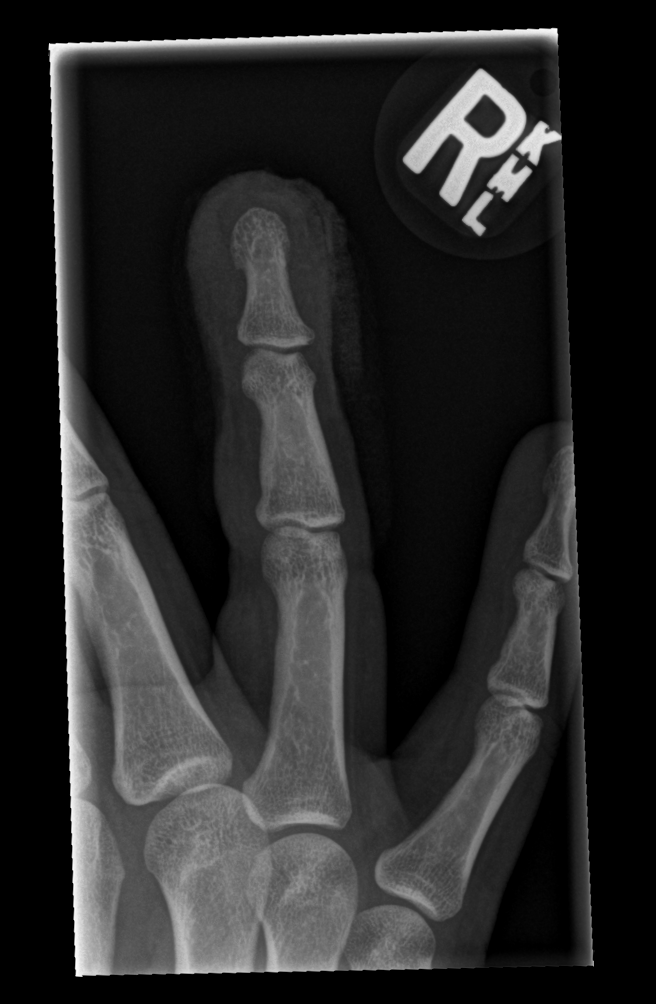

[x finger lat right]
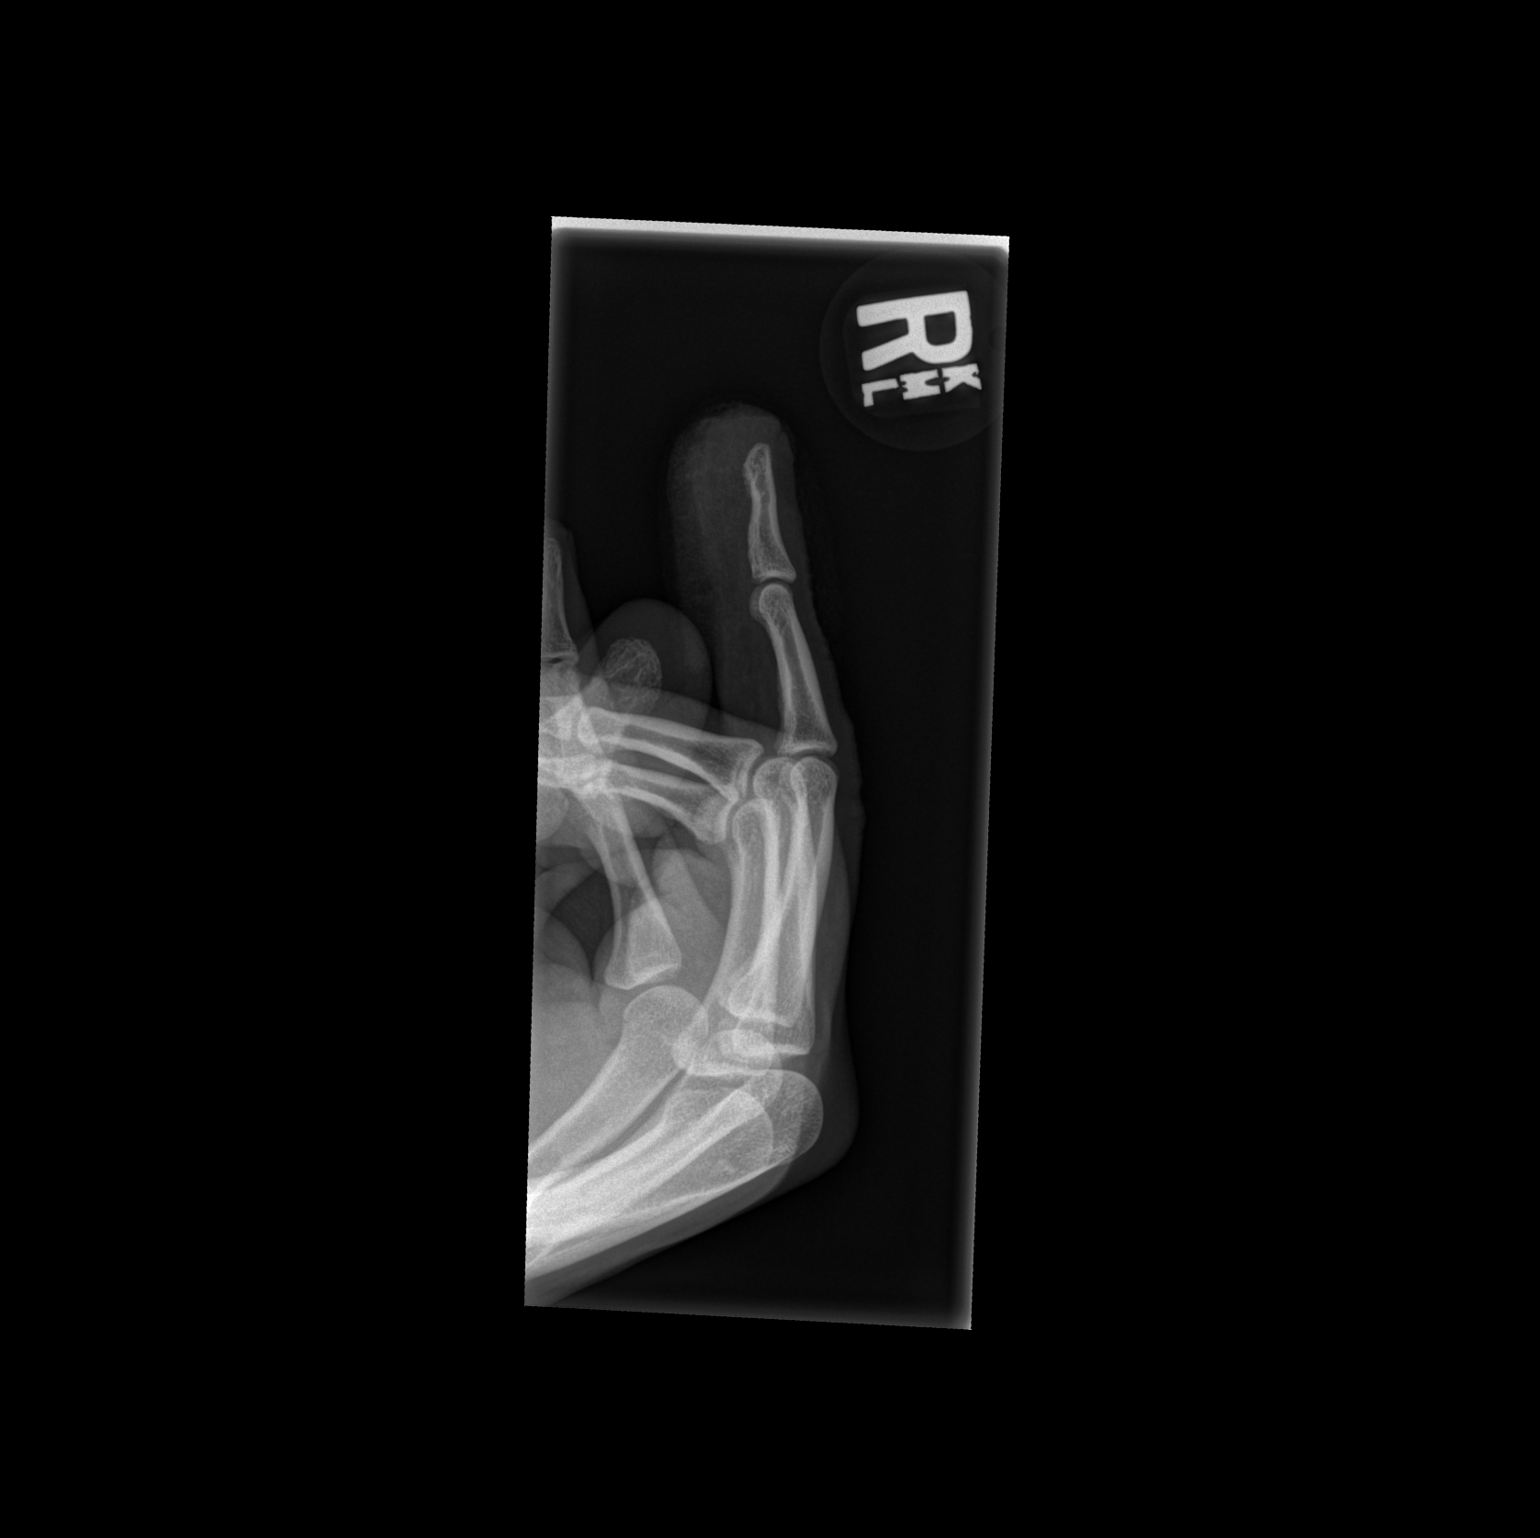

[3 of 3 positions shown; findings below may reference images not displayed]

FINDINGS: Soft tissue swelling is noted consistent with the recent injury. No
definitive radiopaque foreign body is seen. No bony abnormality is
noted.
IMPRESSION: Soft tissue injury without acute bony abnormality.
